# Patient Record
Sex: Female | Born: 1965 | Race: Black or African American | Hispanic: No | Marital: Single | Smoking: Never smoker
Health system: Southern US, Community
[De-identification: ages and names within clinical notes are randomized; demographics above are authoritative.]

## PROBLEM LIST (undated history)

## (undated) ENCOUNTER — Emergency Department (HOSPITAL_BASED_OUTPATIENT_CLINIC_OR_DEPARTMENT_OTHER): Admission: EM | Payer: BLUE CROSS/BLUE SHIELD | Source: Home / Self Care

## (undated) DIAGNOSIS — I10 Essential (primary) hypertension: Secondary | ICD-10-CM

## (undated) HISTORY — PX: MYOMECTOMY: SHX85

---

## 2010-05-13 ENCOUNTER — Emergency Department (HOSPITAL_BASED_OUTPATIENT_CLINIC_OR_DEPARTMENT_OTHER): Admission: EM | Admit: 2010-05-13 | Discharge: 2010-05-13 | Payer: Self-pay | Admitting: Emergency Medicine

## 2010-05-13 ENCOUNTER — Ambulatory Visit: Payer: Self-pay | Admitting: Diagnostic Radiology

## 2010-11-11 ENCOUNTER — Emergency Department (HOSPITAL_BASED_OUTPATIENT_CLINIC_OR_DEPARTMENT_OTHER): Admission: EM | Admit: 2010-11-11 | Discharge: 2010-11-11 | Payer: Self-pay | Admitting: Emergency Medicine

## 2010-11-11 ENCOUNTER — Ambulatory Visit: Payer: Self-pay | Admitting: Interventional Radiology

## 2011-03-11 LAB — DIFFERENTIAL
Basophils Absolute: 0.3 10*3/uL — ABNORMAL HIGH (ref 0.0–0.1)
Basophils Relative: 4 % — ABNORMAL HIGH (ref 0–1)
Eosinophils Absolute: 0.1 10*3/uL (ref 0.0–0.7)
Eosinophils Relative: 1 % (ref 0–5)
Lymphocytes Relative: 27 % (ref 12–46)
Monocytes Absolute: 0.9 10*3/uL (ref 0.1–1.0)

## 2011-03-11 LAB — BASIC METABOLIC PANEL
BUN: 8 mg/dL (ref 6–23)
CO2: 25 mEq/L (ref 19–32)
Chloride: 103 mEq/L (ref 96–112)
Creatinine, Ser: 0.8 mg/dL (ref 0.4–1.2)
Glucose, Bld: 93 mg/dL (ref 70–99)
Potassium: 3.8 mEq/L (ref 3.5–5.1)

## 2011-03-11 LAB — CBC
HCT: 34.3 % — ABNORMAL LOW (ref 36.0–46.0)
MCH: 25.2 pg — ABNORMAL LOW (ref 26.0–34.0)
MCHC: 32.1 g/dL (ref 30.0–36.0)
MCV: 78.5 fL (ref 78.0–100.0)
RDW: 15.8 % — ABNORMAL HIGH (ref 11.5–15.5)

## 2011-03-11 LAB — POCT CARDIAC MARKERS
CKMB, poc: <1 ng/mL — ABNORMAL LOW (ref 1.0–8.0)
CKMB, poc: <1 ng/mL — ABNORMAL LOW (ref 1.0–8.0)
Myoglobin, poc: 53.4 ng/mL (ref 12–200)
Troponin i, poc: <0.05 ng/mL (ref 0.00–0.09)

## 2011-03-17 LAB — URINALYSIS, ROUTINE W REFLEX MICROSCOPIC
Glucose, UA: NEGATIVE mg/dL
Hgb urine dipstick: NEGATIVE
Ketones, ur: NEGATIVE mg/dL
Protein, ur: NEGATIVE mg/dL

## 2011-04-26 ENCOUNTER — Emergency Department (HOSPITAL_BASED_OUTPATIENT_CLINIC_OR_DEPARTMENT_OTHER)
Admission: EM | Admit: 2011-04-26 | Discharge: 2011-04-26 | Disposition: A | Discharge status: Home / Self Care | Payer: No Typology Code available for payment source | Attending: Emergency Medicine | Admitting: Emergency Medicine

## 2011-04-26 ENCOUNTER — Emergency Department (INDEPENDENT_AMBULATORY_CARE_PROVIDER_SITE_OTHER): Payer: No Typology Code available for payment source

## 2011-04-26 DIAGNOSIS — K219 Gastro-esophageal reflux disease without esophagitis: Secondary | ICD-10-CM | POA: Insufficient documentation

## 2011-04-26 DIAGNOSIS — Y92009 Unspecified place in unspecified non-institutional (private) residence as the place of occurrence of the external cause: Secondary | ICD-10-CM | POA: Insufficient documentation

## 2011-04-26 DIAGNOSIS — S335XXA Sprain of ligaments of lumbar spine, initial encounter: Secondary | ICD-10-CM | POA: Insufficient documentation

## 2011-04-26 DIAGNOSIS — X503XXA Overexertion from repetitive movements, initial encounter: Secondary | ICD-10-CM | POA: Insufficient documentation

## 2011-04-26 DIAGNOSIS — M545 Low back pain: Secondary | ICD-10-CM

## 2011-08-23 ENCOUNTER — Emergency Department (HOSPITAL_BASED_OUTPATIENT_CLINIC_OR_DEPARTMENT_OTHER)
Admission: EM | Admit: 2011-08-23 | Discharge: 2011-08-23 | Disposition: A | Discharge status: Home / Self Care | Payer: No Typology Code available for payment source | Attending: Emergency Medicine | Admitting: Emergency Medicine

## 2011-08-23 ENCOUNTER — Emergency Department (INDEPENDENT_AMBULATORY_CARE_PROVIDER_SITE_OTHER): Payer: No Typology Code available for payment source

## 2011-08-23 ENCOUNTER — Encounter: Payer: Self-pay | Admitting: *Deleted

## 2011-08-23 DIAGNOSIS — X58XXXA Exposure to other specified factors, initial encounter: Secondary | ICD-10-CM | POA: Insufficient documentation

## 2011-08-23 DIAGNOSIS — S339XXA Sprain of unspecified parts of lumbar spine and pelvis, initial encounter: Secondary | ICD-10-CM | POA: Insufficient documentation

## 2011-08-23 DIAGNOSIS — S39012A Strain of muscle, fascia and tendon of lower back, initial encounter: Secondary | ICD-10-CM

## 2011-08-23 DIAGNOSIS — M25539 Pain in unspecified wrist: Secondary | ICD-10-CM

## 2011-08-23 DIAGNOSIS — Z79899 Other long term (current) drug therapy: Secondary | ICD-10-CM | POA: Insufficient documentation

## 2011-08-23 DIAGNOSIS — S63509A Unspecified sprain of unspecified wrist, initial encounter: Secondary | ICD-10-CM | POA: Insufficient documentation

## 2011-08-23 MED ORDER — NAPROXEN 500 MG PO TABS: 500.0000 mg | ORAL_TABLET | Freq: Two times a day (BID) | ORAL | Status: DC

## 2011-08-23 MED ORDER — IBUPROFEN 800 MG PO TABS
800.0000 mg | ORAL_TABLET | Freq: Once | ORAL | Status: AC
  Administered 2011-08-23: 800 mg via ORAL
  Filled 2011-08-23: qty 1

## 2011-08-23 MED ORDER — TRAMADOL HCL 50 MG PO TABS: 50.0000 mg | ORAL_TABLET | Freq: Four times a day (QID) | ORAL | Status: DC | PRN

## 2011-08-23 NOTE — ED Notes (Addendum)
Pt states that she cannot take tramadol because it makes her sick on her stomach.  Pt states that she would like to 800mg  of motrin in place of the tramadol.  Dr Brooke Dare states that is fine, removed RX for tramadol from her d/c paperwork

## 2011-08-23 NOTE — ED Notes (Signed)
Pt states she was helping her sister move some stuff on Friday and a piece of furniture fell back on her. Now c/o left wrist and lower back pain.

## 2011-08-23 NOTE — ED Provider Notes (Signed)
History   Chart scribed for Dayton Bailiff, MD by Dayton Bailiff; the patient was seen in room MH07/MH07; this patient's care was started at 8:26 PM.    CSN: [id number] Arrival date & time: 08/23/2011  8:19 PM  Chief Complaint  Patient presents with  . Muscle Pain   HPI Kelli Khan is a 45 y.o. female who presents to the Emergency Department complaining of wrist pain. Pt states left wrist pain onset after moving/heavy lifting yesterday, describes pain as an aching that is worse with movement of wrist. Pt tried heat on wrist with minimal relief today. Also c/o diffuse back pain, also described as aching that is worse with movement, since yesterday as well. No numbness, tingling, or weakness in upper or lower extremities. No bowel or bladder incontinence. No other complaints.  PAST MEDICAL HISTORY:  History reviewed. No pertinent past medical history.   PAST SURGICAL HISTORY:  History reviewed. No pertinent past surgical history.  MEDICATIONS:  Previous Medications   ACETAMINOPHEN (TYLENOL) 500 MG TABLET    Take 1,000 mg by mouth every 6 (six) hours as needed. For pain    CALCIUM CARBONATE (TUMS - DOSED IN MG ELEMENTAL CALCIUM) 500 MG CHEWABLE TABLET    Chew 2 tablets by mouth 2 (two) times daily as needed. For reflux     PRESCRIPTION MEDICATION    Apply 1 application topically 2 (two) times daily. Ointment for dry skin on face      ALLERGIES:  Allergies as of 08/23/2011 - Review Complete 08/23/2011  Allergen Reaction Noted  . Codeine Itching 08/23/2011     FAMILY HISTORY:  History reviewed. No pertinent family history.   SOCIAL HISTORY: History   Social History  . Marital Status: Single    Spouse Name: N/A    Number of Children: N/A  . Years of Education: N/A   Occupational History  . Not on file.   Social History Main Topics  . Smoking status: Never Smoker   . Smokeless tobacco: Not on file  . Alcohol Use: No  . Drug Use: No  . Sexually Active: No   Other Topics  Concern  . Not on file   Social History Narrative  . No narrative on file    Review of Systems 10 Systems reviewed and are negative for acute change except as noted in the HPI.  Physical Exam  BP 157/87  Pulse 70  Temp(Src) 98.6 F (37 C) (Oral)  Resp 20  Ht 5' 7.5" (1.715 m)  Wt 200 lb (90.719 kg)  BMI 30.86 kg/m2  SpO2 98%  LMP 07/23/2011  Physical Exam  Nursing note and vitals reviewed. Constitutional: She is oriented to person, place, and time. She appears well-developed and well-nourished. No distress.  HENT:  Head: Normocephalic.  Mouth/Throat: Mucous membranes are normal.  Eyes: Conjunctivae are normal.  Neck: Normal range of motion. Neck supple.  Cardiovascular: Intact distal pulses.   Pulmonary/Chest: Effort normal.  Musculoskeletal: Normal range of motion.       Diffuse back tenderness, no midline tenderness; FROM left wrist with some pain, no point tenderness, bruising or swelling; NVI  Neurological: She is alert and oriented to person, place, and time.       Normal strength BUE and BLE   Skin: Skin is warm and dry. No rash noted.  Psychiatric: She has a normal mood and affect.    ED Course  Procedures  OTHER DATA REVIEWED: Nursing notes and vital signs reviewed. Prior records reviewed.   LABS /  RADIOLOGY: Dg Wrist Complete Left  08/23/2011  *RADIOLOGY REPORT*  Clinical Data: Left wrist pain.  LEFT WRIST - COMPLETE 3+ VIEW  Comparison: None  Findings: Scapholunate space is upper normal limits.  No acute fracture or dislocation.  No aggressive osseous lesion.  IMPRESSION: Scapholunate space at upper normal limits, can be seen with ligamentous injury in the appropriate clinical setting.  No acute fracture or dislocation.  Original Report Authenticated By: Waneta Martins, M.D.    MDM: The patient had no evidence of fracture on her imaging. She did have widening of the scapholunate space within the upper limits of normal. It is still within normal  limits there is no need for urgent orthopedic evaluation. The patient was placed in a Velcro splint she is to leave on except for sleep. There no concerns for a spinal fracture she has no midline tenderness. There is no concern for neurologic injury as her neurologic exam is normal. She is provided a number to follow up with Dr. Magnus Ivan hand surgeon on call today. She'll be provided and anti-inflammatory medication as well as pain medication. She is instructed to apply-the first 2 days and heat thereafter.  IMPRESSION: 1. Sprain of wrist, unspecified site   2. Lumbosacral strain      PLAN: discharge All results reviewed and discussed with pt, questions answered, pt agreeable with plan.   CONDITION ON DISCHARGE: stable   MEDS GIVEN IN ED: ibuprofen (ADVIL,MOTRIN) tablet 800 mg (800 mg Oral Given 08/23/11 2035)     DISCHARGE MEDICATIONS: New Prescriptions   NAPROXEN (NAPROSYN) 500 MG TABLET    Take 1 tablet (500 mg total) by mouth 2 (two) times daily.   TRAMADOL (ULTRAM) 50 MG TABLET    Take 1 tablet (50 mg total) by mouth every 6 (six) hours as needed for pain.     SCRIBE ATTESTATION: I personally performed the services described in this documentation, which was scribed in my presence. The recorded information has been reviewed and considered.       Dayton Bailiff, MD 08/23/11 2203

## 2012-05-17 ENCOUNTER — Encounter (HOSPITAL_BASED_OUTPATIENT_CLINIC_OR_DEPARTMENT_OTHER): Payer: Self-pay | Admitting: Family Medicine

## 2012-05-17 ENCOUNTER — Emergency Department (HOSPITAL_BASED_OUTPATIENT_CLINIC_OR_DEPARTMENT_OTHER): Payer: No Typology Code available for payment source

## 2012-05-17 ENCOUNTER — Emergency Department (HOSPITAL_BASED_OUTPATIENT_CLINIC_OR_DEPARTMENT_OTHER)
Admission: EM | Admit: 2012-05-17 | Discharge: 2012-05-17 | Disposition: A | Discharge status: Home / Self Care | Payer: No Typology Code available for payment source | Attending: Emergency Medicine | Admitting: Emergency Medicine

## 2012-05-17 DIAGNOSIS — I10 Essential (primary) hypertension: Secondary | ICD-10-CM | POA: Insufficient documentation

## 2012-05-17 DIAGNOSIS — R05 Cough: Secondary | ICD-10-CM | POA: Insufficient documentation

## 2012-05-17 DIAGNOSIS — R079 Chest pain, unspecified: Secondary | ICD-10-CM | POA: Insufficient documentation

## 2012-05-17 DIAGNOSIS — S46019A Strain of muscle(s) and tendon(s) of the rotator cuff of unspecified shoulder, initial encounter: Secondary | ICD-10-CM

## 2012-05-17 DIAGNOSIS — M546 Pain in thoracic spine: Secondary | ICD-10-CM | POA: Insufficient documentation

## 2012-05-17 DIAGNOSIS — R059 Cough, unspecified: Secondary | ICD-10-CM | POA: Insufficient documentation

## 2012-05-17 DIAGNOSIS — S43429A Sprain of unspecified rotator cuff capsule, initial encounter: Secondary | ICD-10-CM | POA: Insufficient documentation

## 2012-05-17 DIAGNOSIS — X503XXA Overexertion from repetitive movements, initial encounter: Secondary | ICD-10-CM | POA: Insufficient documentation

## 2012-05-17 HISTORY — DX: Essential (primary) hypertension: I10

## 2012-05-17 LAB — CBC
HCT: 31.7 % — ABNORMAL LOW (ref 36.0–46.0)
MCHC: 32.2 g/dL (ref 30.0–36.0)
MCV: 74.4 fL — ABNORMAL LOW (ref 78.0–100.0)
RBC: 4.26 MIL/uL (ref 3.87–5.11)
WBC: 7.1 10*3/uL (ref 4.0–10.5)

## 2012-05-17 LAB — CARDIAC PANEL(CRET KIN+CKTOT+MB+TROPI): Relative Index: 2 (ref 0.0–2.5)

## 2012-05-17 LAB — BASIC METABOLIC PANEL
CO2: 25 mEq/L (ref 19–32)
Chloride: 100 mEq/L (ref 96–112)
GFR calc non Af Amer: >90 mL/min (ref >90)
Sodium: 136 mEq/L (ref 135–145)

## 2012-05-17 MED ORDER — HYDROCODONE-ACETAMINOPHEN 5-325 MG PO TABS
1.0000 | ORAL_TABLET | ORAL | Status: DC | PRN
  Administered 2012-05-17: 1 via ORAL
  Filled 2012-05-17: qty 1

## 2012-05-17 MED ORDER — HYDROCODONE-ACETAMINOPHEN 5-325 MG PO TABS: 1.0000 | ORAL_TABLET | ORAL | Status: AC | PRN

## 2012-05-17 NOTE — ED Notes (Signed)
Report received from Steven RN. Assuming care of patient at this time. 

## 2012-05-17 NOTE — ED Provider Notes (Signed)
History     CSN: [id number]  Arrival date & time 05/17/12  1815   First MD Initiated Contact with Patient 05/17/12 1832      Chief Complaint  Patient presents with  . Chest Pain    (Consider location/radiation/quality/duration/timing/severity/associated sxs/prior treatment) HPI The patient is a 46 yo woman, history of HTN, presenting with back and chest pain.  The patient notes a 2-day history of back and chest pain, described as a sharp, shooting, "lightning-like" pain, which originates in her right upper back and travels to her right chest diffusely, lasting 1-2 seconds before subsiding, occurring every 10-30 minutes.  The pain occurs at rest, but can be triggered by certain right arm or torso movements.  She notes that the pain "takes her breath away", but she notes no SOB once the initial pain subsides.  No nausea, diaphoresis.  She notes lifting some heavy boxes for work 1-2 days before symptom onset.  She notes a dry cough for the last month which she attributes to allergies.  No abd pain, nausea, vomiting, diarrhea, constipation, LE edema, dizziness, presyncope.  She has a family history of MI in mother in 30's-40's and HTN, but no HL, smoking, or DM.  Past Medical History  Diagnosis Date  . Hypertension   . Anemia     Past Surgical History  Procedure Date  . Myomectomy     Family History  Problem Relation Age of Onset  . Heart attack Mother     age "57-40's"  . Heart attack Sister     age "87-50's:    History  Substance Use Topics  . Smoking status: Never Smoker   . Smokeless tobacco: Not on file  . Alcohol Use: Yes    OB History    Grav Para Term Preterm Abortions TAB SAB Ect Mult Living                  Review of Systems ROS General: no fevers, chills, changes in weight, changes in appetite Skin: no rash HEENT: no blurry vision, hearing changes, sore throat Pulm: no dyspnea, coughing, wheezing CV: no chest pain, palpitations, shortness of breath Abd:  no abdominal pain, nausea/vomiting, diarrhea/constipation GU: no dysuria, hematuria, polyuria Ext: no arthralgias, myalgias Neuro: no weakness, numbness, or tingling   Allergies  Codeine  Home Medications   Current Outpatient Rx  Name Route Sig Dispense Refill  . HYDROCHLOROTHIAZIDE PO Oral Take by mouth.    . ACETAMINOPHEN 500 MG PO TABS Oral Take 1,000 mg by mouth every 6 (six) hours as needed. For pain     . CALCIUM CARBONATE ANTACID 500 MG PO CHEW Oral Chew 2 tablets by mouth 2 (two) times daily as needed. For reflux      . NAPROXEN 500 MG PO TABS Oral Take 1 tablet (500 mg total) by mouth 2 (two) times daily. 30 tablet 0  . PRESCRIPTION MEDICATION Topical Apply 1 application topically 2 (two) times daily. Ointment for dry skin on face       BP 141/79  Pulse 76  Temp(Src) 98.9 F (37.2 C) (Oral)  Resp 16  Ht 5' 7.5" (1.715 m)  Wt 232 lb (105.235 kg)  BMI 35.80 kg/m2  SpO2 100%  LMP 05/13/2012  Physical Exam General: alert, cooperative, appears mildly anxious HEENT: pupils equal round and reactive to light, vision grossly intact, oropharynx clear and non-erythematous  Neck: supple, no lymphadenopathy Lungs: clear to ascultation bilaterally, normal work of respiration, no wheezes, rales, ronchi Heart: regular  rate and rhythm, no murmurs, gallops, or rubs Abdomen: soft, non-tender, non-distended, normal bowel sounds Msk: Back ttp of lateral and inferior scapula, + neer and hawkins tests (impingement), + supraspinatus test, +subscapularis test, - infraspinatus test.  Msk tests fully reproduce patient's presenting complaint of back pain, but produce no radiation to chest. Neurologic: alert & oriented X3, cranial nerves II-XII intact, strength 4/5 with right shoulder adduction, abduction, and flexion, otherwise strength 5/5 throughout, sensation intact to light touch  ED Course  Procedures (including critical care time)  Labs Reviewed  CBC - Abnormal; Notable for the  following:    Hemoglobin 10.2 (*)    HCT 31.7 (*)    MCV 74.4 (*)    MCH 23.9 (*)    RDW 16.0 (*)    Platelets 407 (*)    All other components within normal limits  CARDIAC PANEL(CRET KIN+CKTOT+MB+TROPI)  BASIC METABOLIC PANEL   Dg Chest 2 View  05/17/2012  *RADIOLOGY REPORT*  Clinical Data:  Right-sided chest pain and shortness of breath.  CHEST - 2 VIEW  Comparison: 11/11/2010  Findings: The heart size and mediastinal contours are within normal limits.  Both lungs are clear.  The visualized skeletal structures are unremarkable.  IMPRESSION: No active disease.  Original Report Authenticated By: Reola Calkins, M.D.   EKG: NSR, old TWI in III, AVR, and V1 seen on prior EKG 11/11/10, otherwise no ST abnormalities  No diagnosis found.    MDM   1. Chest/back pain - the patient notes a 2-day history of right shoulder pain, radiating to the chest, after lifting some heavy boxes.  Back pain is reproducible with impingement, supraspinatus, and subscapularis maneuvers.  Pain likely represents rotator cuff strain.  R/o ACS given +FH and +HTN, though TIMI score = 0, making UA vs NSTEMI very unlikely.  Pneumonia unlikely given lack of fever or productive cough. -EKG unremarkable -CXR -cardiac enzymes x1, bmet, cbc -norco prn for pain  2. Dispo - likely discharge home with instructions for exercises and pain management for rotator cuff strain, pending labs and cxr.  Awanda Mink 05/17/2012, 7:34 PM  8:05 pm - labs wnl, CE neg, CXR neg.  Will discharge home with instructions for exercises for rotator cuff strain.  Linward Headland, MD 05/17/12 2005

## 2012-05-17 NOTE — ED Notes (Signed)
Pt sts right sided chest and upper back pain started when sleeping Saturday morning. Pt sts pain is sharp and last 5-6seconds and is not as severe. Pt reports movement makes pain worse.

## 2012-05-17 NOTE — Discharge Instructions (Signed)
Impingement Syndrome, Rotator Cuff, Bursitis with Rehab Impingement syndrome is a condition that involves inflammation of the tendons of the rotator cuff and the subacromial bursa, that causes pain in the shoulder. The rotator cuff consists of four tendons and muscles that control much of the shoulder and upper arm function. The subacromial bursa is a fluid filled sac that helps reduce friction between the rotator cuff and one of the bones of the shoulder (acromion). Impingement syndrome is usually an overuse injury that causes swelling of the bursa (bursitis), swelling of the tendon (tendonitis), and/or a tear of the tendon (strain). Strains are classified into three categories. Grade 1 strains cause pain, but the tendon is not lengthened. Grade 2 strains include a lengthened ligament, due to the ligament being stretched or partially ruptured. With grade 2 strains there is still function, although the function may be decreased. Grade 3 strains include a complete tear of the tendon or muscle, and function is usually impaired. SYMPTOMS   Pain around the shoulder, often at the outer portion of the upper arm.   Pain that gets worse with shoulder function, especially when reaching overhead or lifting.   Sometimes, aching when not using the arm.   Pain that wakes you up at night.   Sometimes, tenderness, swelling, warmth, or redness over the affected area.   Loss of strength.   Limited motion of the shoulder, especially reaching behind the back (to the back pocket or to unhook bra) or across your body.   Crackling sound (crepitation) when moving the arm.   Biceps tendon pain and inflammation (in the front of the shoulder). Worse when bending the elbow or lifting.  CAUSES  Impingement syndrome is often an overuse injury, in which chronic (repetitive) motions cause the tendons or bursa to become inflamed. A strain occurs when a force is paced on the tendon or muscle that is greater than it can  withstand. Common mechanisms of injury include: Stress from sudden increase in duration, frequency, or intensity of training.  Direct hit (trauma) to the shoulder.   Aging, erosion of the tendon with normal use.   Bony bump on shoulder (acromial spur).  RISK INCREASES WITH:  Contact sports (football, wrestling, boxing).   Throwing sports (baseball, tennis, volleyball).   Weightlifting and bodybuilding.   Heavy labor.   Previous injury to the rotator cuff, including impingement.   Poor shoulder strength and flexibility.   Failure to warm up properly before activity.   Inadequate protective equipment.   Old age.   Bony bump on shoulder (acromial spur).  PREVENTION   Warm up and stretch properly before activity.   Allow for adequate recovery between workouts.   Maintain physical fitness:   Strength, flexibility, and endurance.   Cardiovascular fitness.   Learn and use proper exercise technique.  PROGNOSIS  If treated properly, impingement syndrome usually goes away within 6 weeks. Sometimes surgery is required.  RELATED COMPLICATIONS   Longer healing time if not properly treated, or if not given enough time to heal.   Recurring symptoms, that result in a chronic condition.   Shoulder stiffness, frozen shoulder, or loss of motion.   Rotator cuff tendon tear.   Recurring symptoms, especially if activity is resumed too soon, with overuse, with a direct blow, or when using poor technique.  TREATMENT  Treatment first involves the use of ice and medicine, to reduce pain and inflammation. The use of strengthening and stretching exercises may help reduce pain with activity. These exercises may   be performed at home or with a therapist. If non-surgical treatment is unsuccessful after more than 6 months, surgery may be advised. After surgery and rehabilitation, activity is usually possible in 3 months.  MEDICATION  If pain medicine is needed, nonsteroidal  anti-inflammatory medicines (aspirin and ibuprofen), or other minor pain relievers (acetaminophen), are often advised.   Do not take pain medicine for 7 days before surgery.   Prescription pain relievers may be given, if your caregiver thinks they are needed. Use only as directed and only as much as you need.   Corticosteroid injections may be given by your caregiver. These injections should be reserved for the most serious cases, because they may only be given a certain number of times.  HEAT AND COLD  Cold treatment (icing) should be applied for 10 to 15 minutes every 2 to 3 hours for inflammation and pain, and immediately after activity that aggravates your symptoms. Use ice packs or an ice massage.   Heat treatment may be used before performing stretching and strengthening activities prescribed by your caregiver, physical therapist, or athletic trainer. Use a heat pack or a warm water soak.  SEEK MEDICAL CARE IF:   Symptoms get worse or do not improve in 4 to 6 weeks, despite treatment.   New, unexplained symptoms develop. (Drugs used in treatment may produce side effects.)  EXERCISES  RANGE OF MOTION (ROM) AND STRETCHING EXERCISES - Impingement Syndrome (Rotator Cuff  Tendinitis, Bursitis) These exercises may help you when beginning to rehabilitate your injury. Your symptoms may go away with or without further involvement from your physician, physical therapist or athletic trainer. While completing these exercises, remember:   Restoring tissue flexibility helps normal motion to return to the joints. This allows healthier, less painful movement and activity.   An effective stretch should be held for at least 30 seconds.   A stretch should never be painful. You should only feel a gentle lengthening or release in the stretched tissue.  STRETCH - Flexion, Standing  Stand with good posture. With an underhand grip on your right / left hand, and an overhand grip on the opposite hand, grasp  a broomstick or cane so that your hands are a little more than shoulder width apart.   Keeping your right / left elbow straight and shoulder muscles relaxed, push the stick with your opposite hand, to raise your right / left arm in front of your body and then overhead. Raise your arm until you feel a stretch in your right / left shoulder, but before you have increased shoulder pain.   Try to avoid shrugging your right / left shoulder as your arm rises, by keeping your shoulder blade tucked down and toward your mid-back spine. Hold for __________ seconds.   Slowly return to the starting position.  Repeat __________ times. Complete this exercise __________ times per day. STRETCH - Abduction, Supine  Lie on your back. With an underhand grip on your right / left hand and an overhand grip on the opposite hand, grasp a broomstick or cane so that your hands are a little more than shoulder width apart.   Keeping your right / left elbow straight and your shoulder muscles relaxed, push the stick with your opposite hand, to raise your right / left arm out to the side of your body and then overhead. Raise your arm until you feel a stretch in your right / left shoulder, but before you have increased shoulder pain.   Try to avoid shrugging   your right / left shoulder as your arm rises, by keeping your shoulder blade tucked down and toward your mid-back spine. Hold for __________ seconds.   Slowly return to the starting position.  Repeat __________ times. Complete this exercise __________ times per day. ROM - Flexion, Active-Assisted  Lie on your back. You may bend your knees for comfort.   Grasp a broomstick or cane so your hands are about shoulder width apart. Your right / left hand should grip the end of the stick, so that your hand is positioned "thumbs-up," as if you were about to shake hands.   Using your healthy arm to lead, raise your right / left arm overhead, until you feel a gentle stretch in your  shoulder. Hold for __________ seconds.   Use the stick to assist in returning your right / left arm to its starting position.  Repeat __________ times. Complete this exercise __________ times per day.  ROM - Internal Rotation, Supine   Lie on your back on a firm surface. Place your right / left elbow about 60 degrees away from your side. Elevate your elbow with a folded towel, so that the elbow and shoulder are the same height.   Using a broomstick or cane and your strong arm, pull your right / left hand toward your body until you feel a gentle stretch, but no increase in your shoulder pain. Keep your shoulder and elbow in place throughout the exercise.   Hold for __________ seconds. Slowly return to the starting position.  Repeat __________ times. Complete this exercise __________ times per day. STRETCH - Internal Rotation  Place your right / left hand behind your back, palm up.   Throw a towel or belt over your opposite shoulder. Grasp the towel with your right / left hand.   While keeping an upright posture, gently pull up on the towel, until you feel a stretch in the front of your right / left shoulder.   Avoid shrugging your right / left shoulder as your arm rises, by keeping your shoulder blade tucked down and toward your mid-back spine.   Hold for __________ seconds. Release the stretch, by lowering your healthy hand.  Repeat __________ times. Complete this exercise __________ times per day. ROM - Internal Rotation   Using an underhand grip, grasp a stick behind your back with both hands.   While standing upright with good posture, slide the stick up your back until you feel a mild stretch in the front of your shoulder.   Hold for __________ seconds. Slowly return to your starting position.  Repeat __________ times. Complete this exercise __________ times per day.  STRETCH - Posterior Shoulder Capsule   Stand or sit with good posture. Grasp your right / left elbow and draw it  across your chest, keeping it at the same height as your shoulder.   Pull your elbow, so your upper arm comes in closer to your chest. Pull until you feel a gentle stretch in the back of your shoulder.   Hold for __________ seconds.  Repeat __________ times. Complete this exercise __________ times per day. STRENGTHENING EXERCISES - Impingement Syndrome (Rotator Cuff Tendinitis, Bursitis) These exercises may help you when beginning to rehabilitate your injury. They may resolve your symptoms with or without further involvement from your physician, physical therapist or athletic trainer. While completing these exercises, remember:  Muscles can gain both the endurance and the strength needed for everyday activities through controlled exercises.   Complete these exercises as   instructed by your physician, physical therapist or athletic trainer. Increase the resistance and repetitions only as guided.   You may experience muscle soreness or fatigue, but the pain or discomfort you are trying to eliminate should never worsen during these exercises. If this pain does get worse, stop and make sure you are following the directions exactly. If the pain is still present after adjustments, discontinue the exercise until you can discuss the trouble with your clinician.   During your recovery, avoid activity or exercises which involve actions that place your injured hand or elbow above your head or behind your back or head. These positions stress the tissues which you are trying to heal.  STRENGTH - Scapular Depression and Adduction   With good posture, sit on a firm chair. Support your arms in front of you, with pillows, arm rests, or on a table top. Have your elbows in line with the sides of your body.   Gently draw your shoulder blades down and toward your mid-back spine. Gradually increase the tension, without tensing the muscles along the top of your shoulders and the back of your neck.   Hold for  __________ seconds. Slowly release the tension and relax your muscles completely before starting the next repetition.   After you have practiced this exercise, remove the arm support and complete the exercise in standing as well as sitting position.  Repeat __________ times. Complete this exercise __________ times per day.  STRENGTH - Shoulder Abductors, Isometric  With good posture, stand or sit about 4-6 inches from a wall, with your right / left side facing the wall.   Bend your right / left elbow. Gently press your right / left elbow into the wall. Increase the pressure gradually, until you are pressing as hard as you can, without shrugging your shoulder or increasing any shoulder discomfort.   Hold for __________ seconds.   Release the tension slowly. Relax your shoulder muscles completely before you begin the next repetition.  Repeat __________ times. Complete this exercise __________ times per day.  STRENGTH - External Rotators, Isometric  Keep your right / left elbow at your side and bend it 90 degrees.   Step into a door frame so that the outside of your right / left wrist can press against the door frame without your upper arm leaving your side.   Gently press your right / left wrist into the door frame, as if you were trying to swing the back of your hand away from your stomach. Gradually increase the tension, until you are pressing as hard as you can, without shrugging your shoulder or increasing any shoulder discomfort.   Hold for __________ seconds.   Release the tension slowly. Relax your shoulder muscles completely before you begin the next repetition.  Repeat __________ times. Complete this exercise __________ times per day.  STRENGTH - Supraspinatus   Stand or sit with good posture. Grasp a __________ weight, or an exercise band or tubing, so that your hand is "thumbs-up," like you are shaking hands.   Slowly lift your right / left arm in a "V" away from your thigh,  diagonally into the space between your side and straight ahead. Lift your hand to shoulder height or as far as you can, without increasing any shoulder pain. At first, many people do not lift their hands above shoulder height.   Avoid shrugging your right / left shoulder as your arm rises, by keeping your shoulder blade tucked down and toward your mid-back   spine.   Hold for __________ seconds. Control the descent of your hand, as you slowly return to your starting position.  Repeat __________ times. Complete this exercise __________ times per day.  STRENGTH - External Rotators  Secure a rubber exercise band or tubing to a fixed object (table, pole) so that it is at the same height as your right / left elbow when you are standing or sitting on a firm surface.   Stand or sit so that the secured exercise band is at your uninjured side.   Bend your right / left elbow 90 degrees. Place a folded towel or small pillow under your right / left arm, so that your elbow is a few inches away from your side.   Keeping the tension on the exercise band, pull it away from your body, as if pivoting on your elbow. Be sure to keep your body steady, so that the movement is coming only from your rotating shoulder.   Hold for __________ seconds. Release the tension in a controlled manner, as you return to the starting position.  Repeat __________ times. Complete this exercise __________ times per day.  STRENGTH - Internal Rotators   Secure a rubber exercise band or tubing to a fixed object (table, pole) so that it is at the same height as your right / left elbow when you are standing or sitting on a firm surface.   Stand or sit so that the secured exercise band is at your right / left side.   Bend your elbow 90 degrees. Place a folded towel or small pillow under your right / left arm so that your elbow is a few inches away from your side.   Keeping the tension on the exercise band, pull it across your body,  toward your stomach. Be sure to keep your body steady, so that the movement is coming only from your rotating shoulder.   Hold for __________ seconds. Release the tension in a controlled manner, as you return to the starting position.  Repeat __________ times. Complete this exercise __________ times per day.  STRENGTH - Scapular Protractors, Standing   Stand arms length away from a wall. Place your hands on the wall, keeping your elbows straight.   Begin by dropping your shoulder blades down and toward your mid-back spine.   To strengthen your protractors, keep your shoulder blades down, but slide them forward on your rib cage. It will feel as if you are lifting the back of your rib cage away from the wall. This is a subtle motion and can be challenging to complete. Ask your caregiver for further instruction, if you are not sure you are doing the exercise correctly.   Hold for __________ seconds. Slowly return to the starting position, resting the muscles completely before starting the next repetition.  Repeat __________ times. Complete this exercise __________ times per day. STRENGTH - Scapular Protractors, Supine  Lie on your back on a firm surface. Extend your right / left arm straight into the air while holding a __________ weight in your hand.   Keeping your head and back in place, lift your shoulder off the floor.   Hold for __________ seconds. Slowly return to the starting position, and allow your muscles to relax completely before starting the next repetition.  Repeat __________ times. Complete this exercise __________ times per day. STRENGTH - Scapular Protractors, Quadruped  Get onto your hands and knees, with your shoulders directly over your hands (or as close as you can   be, comfortably).   Keeping your elbows locked, lift the back of your rib cage up into your shoulder blades, so your mid-back rounds out. Keep your neck muscles relaxed.   Hold this position for __________  seconds. Slowly return to the starting position and allow your muscles to relax completely before starting the next repetition.  Repeat __________ times. Complete this exercise __________ times per day.  STRENGTH - Scapular Retractors  Secure a rubber exercise band or tubing to a fixed object (table, pole), so that it is at the height of your shoulders when you are either standing, or sitting on a firm armless chair.   With a palm down grip, grasp an end of the band in each hand. Straighten your elbows and lift your hands straight in front of you, at shoulder height. Step back, away from the secured end of the band, until it becomes tense.   Squeezing your shoulder blades together, draw your elbows back toward your sides, as you bend them. Keep your upper arms lifted away from your body throughout the exercise.   Hold for __________ seconds. Slowly ease the tension on the band, as you reverse the directions and return to the starting position.  Repeat __________ times. Complete this exercise __________ times per day. STRENGTH - Shoulder Extensors   Secure a rubber exercise band or tubing to a fixed object (table, pole) so that it is at the height of your shoulders when you are either standing, or sitting on a firm armless chair.   With a thumbs-up grip, grasp an end of the band in each hand. Straighten your elbows and lift your hands straight in front of you, at shoulder height. Step back, away from the secured end of the band, until it becomes tense.   Squeezing your shoulder blades together, pull your hands down to the sides of your thighs. Do not allow your hands to go behind you.   Hold for __________ seconds. Slowly ease the tension on the band, as you reverse the directions and return to the starting position.  Repeat __________ times. Complete this exercise __________ times per day.  STRENGTH - Scapular Retractors and External Rotators   Secure a rubber exercise band or tubing to a  fixed object (table, pole) so that it is at the height as your shoulders, when you are either standing, or sitting on a firm armless chair.   With a palm down grip, grasp an end of the band in each hand. Bend your elbows 90 degrees and lift your elbows to shoulder height, at your sides. Step back, away from the secured end of the band, until it becomes tense.   Squeezing your shoulder blades together, rotate your shoulders so that your upper arms and elbows remain stationary, but your fists travel upward to head height.   Hold for __________ seconds. Slowly ease the tension on the band, as you reverse the directions and return to the starting position.  Repeat __________ times. Complete this exercise __________ times per day.  STRENGTH - Scapular Retractors and External Rotators, Rowing   Secure a rubber exercise band or tubing to a fixed object (table, pole) so that it is at the height of your shoulders, when you are either standing, or sitting on a firm armless chair.   With a palm down grip, grasp an end of the band in each hand. Straighten your elbows and lift your hands straight in front of you, at shoulder height. Step back, away from the   secured end of the band, until it becomes tense.   Step 1: Squeeze your shoulder blades together. Bending your elbows, draw your hands to your chest, as if you are rowing a boat. At the end of this motion, your hands and elbow should be at shoulder height and your elbows should be out to your sides.   Step 2: Rotate your shoulders, to raise your hands above your head. Your forearms should be vertical and your upper arms should be horizontal.   Hold for __________ seconds. Slowly ease the tension on the band, as you reverse the directions and return to the starting position.  Repeat __________ times. Complete this exercise __________ times per day.  STRENGTH - Scapular Depressors  Find a sturdy chair without wheels, such as a dining room chair.    Keeping your feet on the floor, and your hands on the chair arms, lift your bottom up from the seat, and lock your elbows.   Keeping your elbows straight, allow gravity to pull your body weight down. Your shoulders will rise toward your ears.   Raise your body against gravity by drawing your shoulder blades down your back, shortening the distance between your shoulders and ears. Although your feet should always maintain contact with the floor, your feet should progressively support less body weight, as you get stronger.   Hold for __________ seconds. In a controlled and slow manner, lower your body weight to begin the next repetition.  Repeat __________ times. Complete this exercise __________ times per day.  Document Released: 12/15/2005 Document Revised: 12/04/2011 Document Reviewed: 03/29/2009 [employer] Patient Information 2012 [employer] 

## 2012-05-18 NOTE — ED Provider Notes (Signed)
I saw and evaluated the patient, reviewed the resident's note and I agree with the findings and plan.   .Face to face Exam:  General:  Awake HEENT:  Atraumatic Resp:  Normal effort Abd:  Nondistended Neuro:No focal weakness Lymph: No adenopathy   Nelia Shi, MD 05/18/12 1644

## 2013-08-18 ENCOUNTER — Encounter (HOSPITAL_BASED_OUTPATIENT_CLINIC_OR_DEPARTMENT_OTHER): Payer: Self-pay | Admitting: Emergency Medicine

## 2013-08-18 ENCOUNTER — Emergency Department (HOSPITAL_BASED_OUTPATIENT_CLINIC_OR_DEPARTMENT_OTHER): Payer: No Typology Code available for payment source

## 2013-08-18 ENCOUNTER — Emergency Department (HOSPITAL_BASED_OUTPATIENT_CLINIC_OR_DEPARTMENT_OTHER)
Admission: EM | Admit: 2013-08-18 | Discharge: 2013-08-19 | Disposition: A | Discharge status: Home / Self Care | Payer: No Typology Code available for payment source | Attending: Emergency Medicine | Admitting: Emergency Medicine

## 2013-08-18 DIAGNOSIS — S39012A Strain of muscle, fascia and tendon of lower back, initial encounter: Secondary | ICD-10-CM

## 2013-08-18 DIAGNOSIS — Y929 Unspecified place or not applicable: Secondary | ICD-10-CM | POA: Insufficient documentation

## 2013-08-18 DIAGNOSIS — Z79899 Other long term (current) drug therapy: Secondary | ICD-10-CM | POA: Insufficient documentation

## 2013-08-18 DIAGNOSIS — D649 Anemia, unspecified: Secondary | ICD-10-CM | POA: Insufficient documentation

## 2013-08-18 DIAGNOSIS — X503XXA Overexertion from repetitive movements, initial encounter: Secondary | ICD-10-CM | POA: Insufficient documentation

## 2013-08-18 DIAGNOSIS — I10 Essential (primary) hypertension: Secondary | ICD-10-CM | POA: Insufficient documentation

## 2013-08-18 DIAGNOSIS — S335XXA Sprain of ligaments of lumbar spine, initial encounter: Secondary | ICD-10-CM | POA: Insufficient documentation

## 2013-08-18 DIAGNOSIS — Y9389 Activity, other specified: Secondary | ICD-10-CM | POA: Insufficient documentation

## 2013-08-18 MED ORDER — METHOCARBAMOL 500 MG PO TABS: 500.0000 mg | ORAL_TABLET | Freq: Two times a day (BID) | ORAL | Status: DC

## 2013-08-18 MED ORDER — METHOCARBAMOL 500 MG PO TABS
1000.0000 mg | ORAL_TABLET | Freq: Once | ORAL | Status: DC
  Filled 2013-08-18: qty 2

## 2013-08-18 MED ORDER — TRAMADOL HCL 50 MG PO TABS: 50.0000 mg | ORAL_TABLET | Freq: Four times a day (QID) | ORAL | Status: DC | PRN

## 2013-08-18 MED ORDER — IBUPROFEN 800 MG PO TABS: 800.0000 mg | ORAL_TABLET | Freq: Three times a day (TID) | ORAL | Status: DC

## 2013-08-18 MED ORDER — IBUPROFEN 800 MG PO TABS
800.0000 mg | ORAL_TABLET | Freq: Once | ORAL | Status: AC
  Administered 2013-08-18: 800 mg via ORAL
  Filled 2013-08-18: qty 1

## 2013-08-18 NOTE — ED Notes (Signed)
Pt is driving so robaxin withheld.

## 2013-08-18 NOTE — ED Provider Notes (Signed)
CSN: [id number]     Arrival date & time 08/18/13  2221 History     First MD Initiated Contact with Patient 08/18/13 2300     Chief Complaint  Patient presents with  . Back Pain   (Consider location/radiation/quality/duration/timing/severity/associated sxs/prior Treatment) Patient is a 47 y.o. female presenting with back pain. The history is provided by the patient.  Back Pain Location:  Sacro-iliac joint Quality:  Aching Radiates to: right buttock. Pain severity:  Moderate Pain is:  Same all the time Onset quality:  Sudden Timing:  Constant Progression:  Unchanged Chronicity:  New Context: lifting heavy objects   Relieved by:  Nothing Worsened by:  Nothing tried Ineffective treatments:  None tried Associated symptoms: no abdominal pain, no abdominal swelling, no bladder incontinence, no bowel incontinence, no chest pain, no dysuria, no fever, no headaches, no leg pain, no numbness, no paresthesias, no pelvic pain, no perianal numbness, no tingling, no weakness and no weight loss   Risk factors: no hx of cancer     Past Medical History  Diagnosis Date  . Hypertension   . Anemia    Past Surgical History  Procedure Laterality Date  . Myomectomy     Family History  Problem Relation Age of Onset  . Heart attack Mother     age "82-40's"  . Heart attack Sister     age "52-50's:   History  Substance Use Topics  . Smoking status: Never Smoker   . Smokeless tobacco: Not on file  . Alcohol Use: Yes   OB History   Grav Para Term Preterm Abortions TAB SAB Ect Mult Living                 Review of Systems  Constitutional: Negative for fever and weight loss.  Cardiovascular: Negative for chest pain.  Gastrointestinal: Negative for abdominal pain and bowel incontinence.  Genitourinary: Negative for bladder incontinence, dysuria and pelvic pain.  Musculoskeletal: Positive for back pain.  Neurological: Negative for tingling, weakness, numbness, headaches and paresthesias.   All other systems reviewed and are negative.    Allergies  Codeine  Home Medications   Current Outpatient Rx  Name  Route  Sig  Dispense  Refill  . hydrochlorothiazide (MICROZIDE) 12.5 MG capsule   Oral   Take 12.5 mg by mouth daily.         Marland Kitchen ibuprofen (ADVIL,MOTRIN) 200 MG tablet   Oral   Take 400 mg by mouth every 6 (six) hours as needed. Patient used this medication for chest discomfort.         . IRON CR PO   Oral   Take 1 tablet by mouth daily.         Marland Kitchen PRESCRIPTION MEDICATION   Topical   Apply 1 application topically 2 (two) times daily. Ointment for dry skin on face           BP 155/85  Pulse 81  Temp(Src) 98.5 F (36.9 C) (Oral)  Resp 16  Ht 5' 7.5" (1.715 m)  Wt 227 lb (102.967 kg)  BMI 35.01 kg/m2  SpO2 100% Physical Exam  Constitutional: She is oriented to person, place, and time. She appears well-developed and well-nourished. No distress.  HENT:  Head: Normocephalic and atraumatic.  Mouth/Throat: Oropharynx is clear and moist.  Eyes: Conjunctivae are normal. Pupils are equal, round, and reactive to light.  Neck: Normal range of motion. Neck supple.  Cardiovascular: Normal rate, regular rhythm and intact distal pulses.   Pulmonary/Chest: Effort  normal and breath sounds normal. She has no wheezes. She has no rales.  Abdominal: Soft. Bowel sounds are normal. There is no tenderness. There is no rebound and no guarding.  Musculoskeletal: Normal range of motion.  Neurological: She is alert and oriented to person, place, and time.  Skin: Skin is warm and dry.  Psychiatric: She has a normal mood and affect.    ED Course   Procedures (including critical care time)  Labs Reviewed - No data to display Dg Lumbar Spine Complete  08/18/2013   *RADIOLOGY REPORT*  Clinical Data: Back pain with tingling in the right lower extremity after moving boxes tonight.  LUMBAR SPINE - COMPLETE 4+ VIEW  Comparison: 04/26/2011 lumbar spine.  CT abdomen and pelvis  01/24/2011.  Findings: Five lumbar type vertebral bodies.  Slight anterior subluxation of L4 on L5 demonstrating mild progression since previous study.  Otherwise normal alignment of the lumbar spine. No vertebral compression deformities.  Intervertebral disc space heights are preserved.  No focal bone lesion or bone destruction. Bone cortex and trabecular architecture appear intact.  Large amount of stool in the colon without colonic distension. Calcifications in the pelvis probably represent calcified uterine fibroids.  These calcifications did demonstrate progression since previous study.  IMPRESSION: Slight anterior subluxation of L4 on L5 which is demonstrating some progression since previous study.  Otherwise normal alignment of the lumbar spine.  No displaced fractures are identified. Calcifications in the pelvis likely representing large uterine fibroids.   Original Report Authenticated By: Burman Nieves, M.D.   No diagnosis found.  MDM  Pain medication and follow up with your family doctor  Kasara Schomer K Jakub Debold-Rasch, MD 08/18/13 2354

## 2013-08-18 NOTE — ED Notes (Signed)
Pt is driving tonight

## 2013-08-18 NOTE — ED Notes (Signed)
Pt c/o right sided lower back pain radiating down right leg. Pt states she did move some boxes earlier today.

## 2013-12-20 ENCOUNTER — Emergency Department (HOSPITAL_BASED_OUTPATIENT_CLINIC_OR_DEPARTMENT_OTHER): Payer: No Typology Code available for payment source

## 2013-12-20 ENCOUNTER — Encounter (HOSPITAL_BASED_OUTPATIENT_CLINIC_OR_DEPARTMENT_OTHER): Payer: Self-pay | Admitting: Emergency Medicine

## 2013-12-20 ENCOUNTER — Emergency Department (HOSPITAL_BASED_OUTPATIENT_CLINIC_OR_DEPARTMENT_OTHER)
Admission: EM | Admit: 2013-12-20 | Discharge: 2013-12-20 | Disposition: A | Discharge status: Home / Self Care | Payer: No Typology Code available for payment source | Attending: Emergency Medicine | Admitting: Emergency Medicine

## 2013-12-20 DIAGNOSIS — Y929 Unspecified place or not applicable: Secondary | ICD-10-CM | POA: Insufficient documentation

## 2013-12-20 DIAGNOSIS — I10 Essential (primary) hypertension: Secondary | ICD-10-CM | POA: Insufficient documentation

## 2013-12-20 DIAGNOSIS — X58XXXA Exposure to other specified factors, initial encounter: Secondary | ICD-10-CM | POA: Insufficient documentation

## 2013-12-20 DIAGNOSIS — Y939 Activity, unspecified: Secondary | ICD-10-CM | POA: Insufficient documentation

## 2013-12-20 DIAGNOSIS — Z791 Long term (current) use of non-steroidal anti-inflammatories (NSAID): Secondary | ICD-10-CM | POA: Insufficient documentation

## 2013-12-20 DIAGNOSIS — S29011A Strain of muscle and tendon of front wall of thorax, initial encounter: Secondary | ICD-10-CM

## 2013-12-20 DIAGNOSIS — S23421A Sprain of chondrosternal joint, initial encounter: Secondary | ICD-10-CM | POA: Insufficient documentation

## 2013-12-20 DIAGNOSIS — D649 Anemia, unspecified: Secondary | ICD-10-CM | POA: Insufficient documentation

## 2013-12-20 DIAGNOSIS — Z79899 Other long term (current) drug therapy: Secondary | ICD-10-CM | POA: Insufficient documentation

## 2013-12-20 LAB — CBC WITH DIFFERENTIAL/PLATELET
Basophils Absolute: 0 10*3/uL (ref 0.0–0.1)
Lymphs Abs: 2.1 10*3/uL (ref 0.7–4.0)
MCH: 24.4 pg — ABNORMAL LOW (ref 26.0–34.0)
MCV: 77 fL — ABNORMAL LOW (ref 78.0–100.0)
Monocytes Absolute: 0.7 10*3/uL (ref 0.1–1.0)
Platelets: 349 10*3/uL (ref 150–400)
RDW: 22 % — ABNORMAL HIGH (ref 11.5–15.5)

## 2013-12-20 LAB — BASIC METABOLIC PANEL
BUN: 8 mg/dL (ref 6–23)
Creatinine, Ser: 0.7 mg/dL (ref 0.50–1.10)
GFR calc Af Amer: >90 mL/min (ref >90)
GFR calc non Af Amer: >90 mL/min (ref >90)

## 2013-12-20 MED ORDER — OXYCODONE-ACETAMINOPHEN 5-325 MG PO TABS
1.0000 | ORAL_TABLET | Freq: Once | ORAL | Status: AC
  Administered 2013-12-20: 1 via ORAL
  Filled 2013-12-20: qty 1

## 2013-12-20 MED ORDER — OXYCODONE-ACETAMINOPHEN 5-325 MG PO TABS: 1.0000 | ORAL_TABLET | Freq: Four times a day (QID) | ORAL | Status: DC | PRN

## 2013-12-20 NOTE — ED Provider Notes (Signed)
CSN: [id number]     Arrival date & time 12/20/13  1500 History   First MD Initiated Contact with Patient 12/20/13 1539     Chief Complaint  Patient presents with  . Chest Pain   (Consider location/radiation/quality/duration/timing/severity/associated sxs/prior Treatment) Patient is a 47 y.o. female presenting with chest pain. The history is provided by the patient.  Chest Pain Pain location:  L chest Pain quality: aching   Pain radiates to:  L shoulder and L arm Pain radiates to the back: no   Pain severity:  Moderate Onset quality:  Gradual Duration:  2 days Timing:  Constant Progression:  Unchanged Chronicity:  New Context: lifting   Context: not breathing and no drug use   Relieved by:  Nothing Worsened by:  Nothing tried Ineffective treatments: naproxen. Associated symptoms: no abdominal pain, no back pain, no cough, no dizziness, no fatigue, no fever, no headache, no nausea, no shortness of breath and not vomiting     Past Medical History  Diagnosis Date  . Hypertension   . Anemia    Past Surgical History  Procedure Laterality Date  . Myomectomy     Family History  Problem Relation Age of Onset  . Heart attack Mother     age "1-40's"  . Heart attack Sister     age "33-50's:   History  Substance Use Topics  . Smoking status: Never Smoker   . Smokeless tobacco: Not on file  . Alcohol Use: No   OB History   Grav Para Term Preterm Abortions TAB SAB Ect Mult Living                 Review of Systems  Constitutional: Negative for fever and fatigue.  HENT: Negative for congestion and drooling.   Eyes: Negative for pain.  Respiratory: Negative for cough and shortness of breath.   Cardiovascular: Positive for chest pain.  Gastrointestinal: Negative for nausea, vomiting, abdominal pain and diarrhea.  Genitourinary: Negative for dysuria and hematuria.  Musculoskeletal: Negative for back pain, gait problem and neck pain.  Skin: Negative for color change.   Neurological: Negative for dizziness and headaches.  Hematological: Negative for adenopathy.  Psychiatric/Behavioral: Negative for behavioral problems.  All other systems reviewed and are negative.    Allergies  Codeine  Home Medications   Current Outpatient Rx  Name  Route  Sig  Dispense  Refill  . NAPROXEN PO   Oral   Take by mouth.         . hydrochlorothiazide (MICROZIDE) 12.5 MG capsule   Oral   Take 12.5 mg by mouth daily.         Marland Kitchen ibuprofen (ADVIL,MOTRIN) 200 MG tablet   Oral   Take 400 mg by mouth every 6 (six) hours as needed. Patient used this medication for chest discomfort.         Marland Kitchen ibuprofen (ADVIL,MOTRIN) 800 MG tablet   Oral   Take 1 tablet (800 mg total) by mouth 3 (three) times daily.   21 tablet   0   . IRON CR PO   Oral   Take 1 tablet by mouth daily.         . methocarbamol (ROBAXIN) 500 MG tablet   Oral   Take 1 tablet (500 mg total) by mouth 2 (two) times daily.   20 tablet   0   . PRESCRIPTION MEDICATION   Topical   Apply 1 application topically 2 (two) times daily. Ointment for dry skin on  face          . traMADol (ULTRAM) 50 MG tablet   Oral   Take 1 tablet (50 mg total) by mouth every 6 (six) hours as needed for pain.   15 tablet   0    BP 161/93  Pulse 70  Temp(Src) 99 F (37.2 C) (Oral)  Resp 18  Ht 5\' 7"  (1.702 m)  Wt 215 lb (97.523 kg)  BMI 33.67 kg/m2  SpO2 100% Physical Exam  Nursing note and vitals reviewed. Constitutional: She is oriented to person, place, and time. She appears well-developed and well-nourished.  HENT:  Head: Normocephalic.  Mouth/Throat: Oropharynx is clear and moist. No oropharyngeal exudate.  Eyes: Conjunctivae and EOM are normal. Pupils are equal, round, and reactive to light.  Neck: Normal range of motion. Neck supple.  Cardiovascular: Normal rate, regular rhythm, normal heart sounds and intact distal pulses.  Exam reveals no gallop and no friction rub.   No murmur  heard. Pulmonary/Chest: Effort normal and breath sounds normal. No respiratory distress. She has no wheezes. She exhibits tenderness (the patient's pain is easily reproduced with mild palpation of the left upper anterior chest, the left shoulder and the left upper arm.).  Abdominal: Soft. Bowel sounds are normal. There is no tenderness. There is no rebound and no guarding.  Musculoskeletal: Normal range of motion. She exhibits no edema and no tenderness.  Pain is reproduced with range of motion of the left shoulder.  Neurological: She is alert and oriented to person, place, and time.  Skin: Skin is warm and dry.  Psychiatric: She has a normal mood and affect. Her behavior is normal.    ED Course  Procedures (including critical care time) Labs Review Labs Reviewed  CBC WITH DIFFERENTIAL - Abnormal; Notable for the following:    Hemoglobin 10.6 (*)    HCT 33.4 (*)    MCV 77.0 (*)    MCH 24.4 (*)    RDW 22.0 (*)    All other components within normal limits  TROPONIN I  BASIC METABOLIC PANEL   Imaging Review Dg Chest 2 View  12/20/2013   CLINICAL DATA:  Chest pain  EXAM: CHEST  2 VIEW  COMPARISON:  May 17, 2012  FINDINGS: Lungs are clear. Heart size and pulmonary vascularity are normal. No adenopathy. No pneumothorax. No bone lesions. There is slight mid thoracic levoscoliosis.  IMPRESSION: No edema or consolidation.   Electronically Signed   By: Bretta Bang M.D.   On: 12/20/2013 16:19    EKG Interpretation    Date/Time:  Tuesday December 20 2013 15:13:11 EST Ventricular Rate:  69 PR Interval:  134 QRS Duration: 70 QT Interval:  398 QTC Calculation: 426 R Axis:   13 Text Interpretation:  Normal sinus rhythm Low voltage QRS isolated t wave inversion in lead III appears unchanged No significant change since last tracing Confirmed by Ricard Faulkner  MD, Mort Smelser ([id number]) on 12/20/2013 4:56:17 PM            MDM   1. Chest wall muscle strain, initial encounter    3:53 PM 47  y.o. female who presents with left shoulder chest and arm pain which began 2 days ago. The patient states prior to that she had been lifting some boxes and carrying luggage. She states that the pain began gradually and has remained constant since then. She states that it is unwavering. She notes that the pain is reproducible with palpation and range of motion of her shoulder. She  is afebrile and vital signs are unremarkable here. She denies any shortness of breath. She has a history of hypertension and a family history of cardiac disease. I suspect this is a musculoskeletal cause for her pain. Will get screening labwork and Percocet for pain.   I interpreted/reviewed the labs and/or imaging which were non-contributory.  Low risk for MACE per HEART score. I suspect this is msk pain, and pt feeling better on exam.  I have discussed the diagnosis/risks/treatment options with the patient and believe the pt to be eligible for discharge home to follow-up with pcp in 1 week. We also discussed returning to the ED immediately if new or worsening sx occur. We discussed the sx which are most concerning (e.g., worsening pain, sob, fever) that necessitate immediate return. Any new prescriptions provided to the patient are listed below.  Discharge Medication List as of 12/20/2013  5:19 PM    START taking these medications   Details  oxyCODONE-acetaminophen (PERCOCET) 5-325 MG per tablet Take 1 tablet by mouth every 6 (six) hours as needed for moderate pain., Starting 12/20/2013, Until Discontinued, Print         Junius Argyle, MD 12/21/13 1100

## 2013-12-20 NOTE — ED Notes (Signed)
C/o CP started last night-increase pain with movement

## 2013-12-20 NOTE — ED Notes (Signed)
MD at bedside. 

## 2014-01-12 ENCOUNTER — Emergency Department (HOSPITAL_BASED_OUTPATIENT_CLINIC_OR_DEPARTMENT_OTHER)
Admission: EM | Admit: 2014-01-12 | Discharge: 2014-01-12 | Disposition: A | Discharge status: Home / Self Care | Payer: No Typology Code available for payment source | Attending: Emergency Medicine | Admitting: Emergency Medicine

## 2014-01-12 ENCOUNTER — Emergency Department (HOSPITAL_BASED_OUTPATIENT_CLINIC_OR_DEPARTMENT_OTHER): Payer: No Typology Code available for payment source

## 2014-01-12 ENCOUNTER — Encounter (HOSPITAL_BASED_OUTPATIENT_CLINIC_OR_DEPARTMENT_OTHER): Payer: Self-pay | Admitting: Emergency Medicine

## 2014-01-12 DIAGNOSIS — M79605 Pain in left leg: Secondary | ICD-10-CM

## 2014-01-12 DIAGNOSIS — Y929 Unspecified place or not applicable: Secondary | ICD-10-CM | POA: Insufficient documentation

## 2014-01-12 DIAGNOSIS — M25512 Pain in left shoulder: Secondary | ICD-10-CM

## 2014-01-12 DIAGNOSIS — W108XXA Fall (on) (from) other stairs and steps, initial encounter: Secondary | ICD-10-CM | POA: Insufficient documentation

## 2014-01-12 DIAGNOSIS — Y9301 Activity, walking, marching and hiking: Secondary | ICD-10-CM | POA: Insufficient documentation

## 2014-01-12 DIAGNOSIS — Z79899 Other long term (current) drug therapy: Secondary | ICD-10-CM | POA: Insufficient documentation

## 2014-01-12 DIAGNOSIS — D649 Anemia, unspecified: Secondary | ICD-10-CM | POA: Insufficient documentation

## 2014-01-12 DIAGNOSIS — I10 Essential (primary) hypertension: Secondary | ICD-10-CM | POA: Insufficient documentation

## 2014-01-12 DIAGNOSIS — S4980XA Other specified injuries of shoulder and upper arm, unspecified arm, initial encounter: Secondary | ICD-10-CM | POA: Insufficient documentation

## 2014-01-12 DIAGNOSIS — W19XXXA Unspecified fall, initial encounter: Secondary | ICD-10-CM

## 2014-01-12 DIAGNOSIS — S46909A Unspecified injury of unspecified muscle, fascia and tendon at shoulder and upper arm level, unspecified arm, initial encounter: Secondary | ICD-10-CM | POA: Insufficient documentation

## 2014-01-12 DIAGNOSIS — T148XXA Other injury of unspecified body region, initial encounter: Secondary | ICD-10-CM | POA: Insufficient documentation

## 2014-01-12 MED ORDER — ONDANSETRON HCL 4 MG PO TABS: 4.0000 mg | ORAL_TABLET | Freq: Four times a day (QID) | ORAL | Status: DC

## 2014-01-12 MED ORDER — IBUPROFEN 800 MG PO TABS: 800.0000 mg | ORAL_TABLET | Freq: Three times a day (TID) | ORAL | Status: DC | PRN

## 2014-01-12 MED ORDER — OXYCODONE-ACETAMINOPHEN 5-325 MG PO TABS
2.0000 | ORAL_TABLET | Freq: Once | ORAL | Status: AC
  Administered 2014-01-12: 2 via ORAL
  Filled 2014-01-12: qty 2

## 2014-01-12 MED ORDER — OXYCODONE-ACETAMINOPHEN 5-325 MG PO TABS: 1.0000 | ORAL_TABLET | ORAL | Status: DC | PRN

## 2014-01-12 MED ORDER — ONDANSETRON 4 MG PO TBDP
4.0000 mg | ORAL_TABLET | Freq: Once | ORAL | Status: AC
  Administered 2014-01-12: 4 mg via ORAL
  Filled 2014-01-12: qty 1

## 2014-01-12 NOTE — ED Provider Notes (Signed)
TIME SEEN: 11:30 AM  CHIEF COMPLAINT: Fall, left shoulder and left leg pain  HPI: Patient is a 48 year old female with history of hypertension who presents the emergency department after a mechanical fall this morning. Patient reports that she was walking down the stairs and missed one of the steps. She reports that her right leg went out front of her her left leg went behind her. She believes she caught herself with her left arm. She is complaining of left leg pain from her knee down to her foot and left shoulder pain. She is right-hand dominant. No numbness, tingling or focal weakness. No chest pain or shortness of breath. No abdominal pain. She denies any chest pain, shortness of breath, dizziness or palpitations that led to her fall. She is now anticoagulation. She did not hit her head.  ROS: See HPI Constitutional: no fever  Eyes: no drainage  ENT: no runny nose   Cardiovascular:  no chest pain  Resp: no SOB  GI: no vomiting GU: no dysuria Integumentary: no rash  Allergy: no hives  Musculoskeletal: no leg swelling  Neurological: no slurred speech ROS otherwise negative  PAST MEDICAL HISTORY/PAST SURGICAL HISTORY:  Past Medical History  Diagnosis Date  . Hypertension   . Anemia     MEDICATIONS:  Prior to Admission medications   Medication Sig Start Date End Date Taking? Authorizing Provider  hydrochlorothiazide (MICROZIDE) 12.5 MG capsule Take 12.5 mg by mouth daily.    Historical Provider, MD  ibuprofen (ADVIL,MOTRIN) 200 MG tablet Take 400 mg by mouth every 6 (six) hours as needed. Patient used this medication for chest discomfort.    Historical Provider, MD  ibuprofen (ADVIL,MOTRIN) 800 MG tablet Take 1 tablet (800 mg total) by mouth 3 (three) times daily. 08/18/13   April K Palumbo-Rasch, MD  IRON CR PO Take 1 tablet by mouth daily.    Historical Provider, MD  methocarbamol (ROBAXIN) 500 MG tablet Take 1 tablet (500 mg total) by mouth 2 (two) times daily. 08/18/13   April Alfonso Patten, MD  NAPROXEN PO Take by mouth.    Historical Provider, MD  oxyCODONE-acetaminophen (PERCOCET) 5-325 MG per tablet Take 1 tablet by mouth every 6 (six) hours as needed for moderate pain. 12/20/13   Blanchard Kelch, MD  PRESCRIPTION MEDICATION Apply 1 application topically 2 (two) times daily. Ointment for dry skin on face     Historical Provider, MD  traMADol (ULTRAM) 50 MG tablet Take 1 tablet (50 mg total) by mouth every 6 (six) hours as needed for pain. 08/18/13   April Alfonso Patten, MD    ALLERGIES:  Allergies  Allergen Reactions  . Codeine Itching    SOCIAL HISTORY:  History  Substance Use Topics  . Smoking status: Never Smoker   . Smokeless tobacco: Not on file  . Alcohol Use: No    FAMILY HISTORY: Family History  Problem Relation Age of Onset  . Heart attack Mother     age "61-40's"  . Heart attack Sister     age "18-50's:    EXAM: BP 143/84  Pulse 77  Temp(Src) 98.7 F (37.1 C) (Oral)  Ht 5' 7.5" (1.715 m)  Wt 198 lb (89.812 kg)  BMI 30.54 kg/m2  SpO2 100%  LMP 12/29/2013 CONSTITUTIONAL: Alert and oriented and responds appropriately to questions. Well-appearing; well-nourished; GCS 15 HEAD: Normocephalic; atraumatic EYES: Conjunctivae clear, PERRL, EOMI ENT: normal nose; no rhinorrhea; moist mucous membranes; pharynx without lesions noted; no dental injury; no septal hematoma NECK:  Supple, no meningismus, no LAD; no midline spinal tenderness, step-off or deformity CARD: RRR; S1 and S2 appreciated; no murmurs, no clicks, no rubs, no gallops RESP: Normal chest excursion without splinting or tachypnea; breath sounds clear and equal bilaterally; no wheezes, no rhonchi, no rales; chest wall stable, nontender to palpation ABD/GI: Normal bowel sounds; non-distended; soft, non-tender, no rebound, no guarding PELVIS:  stable, nontender to palpation BACK:  The back appears normal and is non-tender to palpation, there is no CVA tenderness; no midline  spinal tenderness, step-off or deformity EXT: Patient is tender to palpation over her dorsal left foot, left anterior shin, left knee diffusely and left anterior shoulder diffusely with decreased range of motion in these joints secondary to pain but no bony deformity or ecchymosis or swelling. Patient is neurovascularly intact distally with 2+ DP pulses bilaterally and 2+ radial pulses bilaterally with normal sensation in all 4 extremities. Otherwise patient has Normal ROM in all joints; non-tender to palpation; no edema; normal capillary refill; no cyanosis    SKIN: Normal color for age and race; warm NEURO: Moves all extremities equally, cranial nerves II through XII intact, sensation to light touch intact diffusely PSYCH: The patient's mood and manner are appropriate. Grooming and personal hygiene are appropriate.  MEDICAL DECISION MAKING: Patient here after mechanical fall. We'll obtain x-rays of her left foot, left ankle, left knee and left shoulder. We'll give oral pain medication. She is hemodynamically stable. She was able to ambulate after her fall.  ED PROGRESS: Patient's x-ray show no dislocation or fracture. We'll discharge with pain medication, orthopedic followup as needed and return precautions. We'll give crutches. Patient verbalizes understanding and is comfortable plan.     [place], DO 01/12/14 1301

## 2014-01-12 NOTE — Discharge Instructions (Signed)
Contusion A contusion is a deep bruise. Contusions are the result of an injury that caused bleeding under the skin. The contusion may turn blue, purple, or yellow. Minor injuries will give you a painless contusion, but more severe contusions may stay painful and swollen for a few weeks.  CAUSES  A contusion is usually caused by a blow, trauma, or direct force to an area of the body. SYMPTOMS   Swelling and redness of the injured area.  Bruising of the injured area.  Tenderness and soreness of the injured area.  Pain. DIAGNOSIS  The diagnosis can be made by taking a history and physical exam. An X-ray, CT scan, or MRI may be needed to determine if there were any associated injuries, such as fractures. TREATMENT  Specific treatment will depend on what area of the body was injured. In general, the best treatment for a contusion is resting, icing, elevating, and applying cold compresses to the injured area. Over-the-counter medicines may also be recommended for pain control. Ask your caregiver what the best treatment is for your contusion. HOME CARE INSTRUCTIONS   Put ice on the injured area.  Put ice in a plastic bag.  Place a towel between your skin and the bag.  Leave the ice on for 15-20 minutes, 03-04 times a day.  Only take over-the-counter or prescription medicines for pain, discomfort, or fever as directed by your caregiver. Your caregiver may recommend avoiding anti-inflammatory medicines (aspirin, ibuprofen, and naproxen) for 48 hours because these medicines may increase bruising.  Rest the injured area.  If possible, elevate the injured area to reduce swelling. SEEK IMMEDIATE MEDICAL CARE IF:   You have increased bruising or swelling.  You have pain that is getting worse.  Your swelling or pain is not relieved with medicines. MAKE SURE YOU:   Understand these instructions.  Will watch your condition.  Will get help right away if you are not doing well or get  worse. Document Released: 09/24/2005 Document Revised: 03/08/2012 Document Reviewed: 10/20/2011 [facility] Patient Information 2014 [place], [place].  Shoulder Pain The shoulder is the joint that connects your arms to your body. The bones that form the shoulder joint include the upper arm bone (humerus), the shoulder blade (scapula), and the collarbone (clavicle). The top of the humerus is shaped like a ball and fits into a rather flat socket on the scapula (glenoid cavity). A combination of muscles and strong, fibrous tissues that connect muscles to bones (tendons) support your shoulder joint and hold the ball in the socket. Small, fluid-filled sacs (bursae) are located in different areas of the joint. They act as cushions between the bones and the overlying soft tissues and help reduce friction between the gliding tendons and the bone as you move your arm. Your shoulder joint allows a wide range of motion in your arm. This range of motion allows you to do things like scratch your back or throw a ball. However, this range of motion also makes your shoulder more prone to pain from overuse and injury. Causes of shoulder pain can originate from both injury and overuse and usually can be grouped in the following four categories:  Redness, swelling, and pain (inflammation) of the tendon (tendinitis) or the bursae (bursitis).  Instability, such as a dislocation of the joint.  Inflammation of the joint (arthritis).  Broken bone (fracture). HOME CARE INSTRUCTIONS   Apply ice to the sore area.  Put ice in a plastic bag.  Place a towel between your skin and  the bag.  Leave the ice on for 15-20 minutes, 03-04 times per day for the first 2 days.  Stop using cold packs if they do not help with the pain.  If you have a shoulder sling or immobilizer, wear it as long as your caregiver instructs. Only remove it to shower or bathe. Move your arm as little as possible, but keep your hand moving to prevent  swelling.  Squeeze a soft ball or foam pad as much as possible to help prevent swelling.  Only take over-the-counter or prescription medicines for pain, discomfort, or fever as directed by your caregiver. SEEK MEDICAL CARE IF:   Your shoulder pain increases, or new pain develops in your arm, hand, or fingers.  Your hand or fingers become cold and numb.  Your pain is not relieved with medicines. SEEK IMMEDIATE MEDICAL CARE IF:   Your arm, hand, or fingers are numb or tingling.  Your arm, hand, or fingers are significantly swollen or turn white or blue. MAKE SURE YOU:   Understand these instructions.  Will watch your condition.  Will get help right away if you are not doing well or get worse. Document Released: 09/24/2005 Document Revised: 09/08/2012 Document Reviewed: 11/29/2011 [facility] Patient Information 2014 [place]. Knee Sprain A knee sprain is a tear in one of the strong, fibrous tissues that connect the bones (ligaments) in your knee. The severity of the sprain depends on how much of the ligament is torn. The tear can be either partial or complete. CAUSES  Often, sprains are a result of a fall or injury. The force of the impact causes the fibers of your ligament to stretch too much. This excess tension causes the fibers of your ligament to tear. SIGNS AND SYMPTOMS  You may have some loss of motion in your knee. Other symptoms include:  Bruising.  Pain in the knee area.  Tenderness of the knee to the touch.  Swelling. DIAGNOSIS  To diagnose a knee sprain, your health care provider will physically examine your knee. Your health care provider may also suggest an X-ray exam of your knee to make sure no bones are broken. TREATMENT  If your ligament is only partially torn, treatment usually involves keeping the knee in a fixed position (immobilization) or bracing your knee for activities that require movement for several weeks. To do this, your health care provider  will apply a bandage, cast, or splint to keep your knee from moving and to support your knee during movement until it heals. For a partially torn ligament, the healing process usually takes 4 6 weeks. If your ligament is completely torn, depending on which ligament it is, you may need surgery to reconnect the ligament to the bone or reconstruct it. After surgery, a cast or splint may be applied and will need to stay on your knee for 4 6 weeks while your ligament heals. HOME CARE INSTRUCTIONS  Keep your injured knee elevated to decrease swelling.  To ease pain and swelling, apply ice to the injured area:  Put ice in a plastic bag.  Place a towel between your skin and the bag.  Leave the ice on for 20 minutes, 2 3 times a day.  Only take medicine for pain as directed by your health care provider.  Do not leave your knee unprotected until pain and stiffness go away (usually 4 6 weeks).  If you have a cast or splint, do not allow it to get wet. If you have been instructed  not to remove it, cover it with a plastic bag when you shower or bathe. Do not swim.  Your health care provider may suggest exercises for you to do during your recovery to prevent or limit permanent weakness and stiffness. SEEK IMMEDIATE MEDICAL CARE IF:  Your cast or splint becomes damaged.  Your pain becomes worse.  You have significant pain, swelling, or numbness below the cast or splint. MAKE SURE YOU:  Understand these instructions.  Will watch your condition.  Will get help right away if you are not doing well or get worse. Document Released: 12/15/2005 Document Revised: 10/05/2013 Document Reviewed: 07/27/2013 [facility] Patient Information 2014 [place]. Ankle Sprain An ankle sprain is an injury to the strong, fibrous tissues (ligaments) that hold the bones of your ankle joint together.  CAUSES An ankle sprain is usually caused by a fall or by twisting your ankle. Ankle sprains most commonly occur  when you step on the outer edge of your foot, and your ankle turns inward. People who participate in sports are more prone to these types of injuries.  SYMPTOMS   Pain in your ankle. The pain may be present at rest or only when you are trying to stand or walk.  Swelling.  Bruising. Bruising may develop immediately or within 1 to 2 days after your injury.  Difficulty standing or walking, particularly when turning corners or changing directions. DIAGNOSIS  Your caregiver will ask you details about your injury and perform a physical exam of your ankle to determine if you have an ankle sprain. During the physical exam, your caregiver will press on and apply pressure to specific areas of your foot and ankle. Your caregiver will try to move your ankle in certain ways. An X-ray exam may be done to be sure a bone was not broken or a ligament did not separate from one of the bones in your ankle (avulsion fracture).  TREATMENT  Certain types of braces can help stabilize your ankle. Your caregiver can make a recommendation for this. Your caregiver may recommend the use of medicine for pain. If your sprain is severe, your caregiver may refer you to a surgeon who helps to restore function to parts of your skeletal system (orthopedist) or a physical therapist. [place] ice to your injury for 1 2 days or as directed by your caregiver. Applying ice helps to reduce inflammation and pain.  Put ice in a plastic bag.  Place a towel between your skin and the bag.  Leave the ice on for 15-20 minutes at a time, every 2 hours while you are awake.  Only take over-the-counter or prescription medicines for pain, discomfort, or fever as directed by your caregiver.  Elevate your injured ankle above the level of your heart as much as possible for 2 3 days.  If your caregiver recommends crutches, use them as instructed. Gradually put weight on the affected ankle. Continue to use crutches or a cane  until you can walk without feeling pain in your ankle.  If you have a plaster splint, wear the splint as directed by your caregiver. Do not rest it on anything harder than a pillow for the first 24 hours. Do not put weight on it. Do not get it wet. You may take it off to take a shower or bath.  You may have been given an elastic bandage to wear around your ankle to provide support. If the elastic bandage is too tight (you have numbness  or tingling in your foot or your foot becomes cold and blue), adjust the bandage to make it comfortable.  If you have an air splint, you may blow more air into it or let air out to make it more comfortable. You may take your splint off at night and before taking a shower or bath. Wiggle your toes in the splint several times per day to decrease swelling. SEEK MEDICAL CARE IF:   You have rapidly increasing bruising or swelling.  Your toes feel extremely cold or you lose feeling in your foot.  Your pain is not relieved with medicine. SEEK IMMEDIATE MEDICAL CARE IF:  Your toes are numb or blue.  You have severe pain that is increasing. MAKE SURE YOU:   Understand these instructions.  Will watch your condition.  Will get help right away if you are not doing well or get worse. Document Released: 12/15/2005 Document Revised: 09/08/2012 Document Reviewed: 12/27/2011 [facility] Patient Information 2014 [place], [place].

## 2014-01-12 NOTE — ED Notes (Signed)
Pt reports she fell down 3-4 steps.  She reports pain on "whole left side".  Tenderness noted to left knee and pain with moving extremity.

## 2014-05-31 ENCOUNTER — Emergency Department (HOSPITAL_BASED_OUTPATIENT_CLINIC_OR_DEPARTMENT_OTHER): Payer: BC Managed Care – PPO

## 2014-05-31 ENCOUNTER — Encounter (HOSPITAL_BASED_OUTPATIENT_CLINIC_OR_DEPARTMENT_OTHER): Payer: Self-pay | Admitting: Emergency Medicine

## 2014-05-31 ENCOUNTER — Emergency Department (HOSPITAL_BASED_OUTPATIENT_CLINIC_OR_DEPARTMENT_OTHER)
Admission: EM | Admit: 2014-05-31 | Discharge: 2014-05-31 | Disposition: A | Discharge status: Home / Self Care | Payer: BC Managed Care – PPO | Attending: Emergency Medicine | Admitting: Emergency Medicine

## 2014-05-31 DIAGNOSIS — S46909A Unspecified injury of unspecified muscle, fascia and tendon at shoulder and upper arm level, unspecified arm, initial encounter: Secondary | ICD-10-CM | POA: Diagnosis present

## 2014-05-31 DIAGNOSIS — Y9289 Other specified places as the place of occurrence of the external cause: Secondary | ICD-10-CM | POA: Diagnosis not present

## 2014-05-31 DIAGNOSIS — Y9389 Activity, other specified: Secondary | ICD-10-CM | POA: Diagnosis not present

## 2014-05-31 DIAGNOSIS — Z791 Long term (current) use of non-steroidal anti-inflammatories (NSAID): Secondary | ICD-10-CM | POA: Diagnosis not present

## 2014-05-31 DIAGNOSIS — S4980XA Other specified injuries of shoulder and upper arm, unspecified arm, initial encounter: Secondary | ICD-10-CM | POA: Diagnosis not present

## 2014-05-31 DIAGNOSIS — IMO0002 Reserved for concepts with insufficient information to code with codable children: Secondary | ICD-10-CM | POA: Insufficient documentation

## 2014-05-31 DIAGNOSIS — W208XXA Other cause of strike by thrown, projected or falling object, initial encounter: Secondary | ICD-10-CM | POA: Insufficient documentation

## 2014-05-31 DIAGNOSIS — S0990XA Unspecified injury of head, initial encounter: Secondary | ICD-10-CM | POA: Diagnosis not present

## 2014-05-31 DIAGNOSIS — Z79899 Other long term (current) drug therapy: Secondary | ICD-10-CM | POA: Insufficient documentation

## 2014-05-31 DIAGNOSIS — J45909 Unspecified asthma, uncomplicated: Secondary | ICD-10-CM | POA: Diagnosis not present

## 2014-05-31 DIAGNOSIS — I10 Essential (primary) hypertension: Secondary | ICD-10-CM | POA: Insufficient documentation

## 2014-05-31 DIAGNOSIS — M25519 Pain in unspecified shoulder: Secondary | ICD-10-CM

## 2014-05-31 DIAGNOSIS — S39012A Strain of muscle, fascia and tendon of lower back, initial encounter: Secondary | ICD-10-CM

## 2014-05-31 MED ORDER — IBUPROFEN 600 MG PO TABS: 600.0000 mg | ORAL_TABLET | Freq: Four times a day (QID) | ORAL | Status: DC | PRN

## 2014-05-31 MED ORDER — ONDANSETRON 4 MG PO TBDP
4.0000 mg | ORAL_TABLET | Freq: Once | ORAL | Status: AC
  Administered 2014-05-31: 4 mg via ORAL

## 2014-05-31 MED ORDER — OXYCODONE-ACETAMINOPHEN 5-325 MG PO TABS: 2.0000 | ORAL_TABLET | ORAL | Status: DC | PRN

## 2014-05-31 MED ORDER — ONDANSETRON 4 MG PO TBDP
ORAL_TABLET | ORAL | Status: AC
  Filled 2014-05-31: qty 1

## 2014-05-31 MED ORDER — ONDANSETRON 4 MG PO TBDP: ORAL_TABLET | ORAL | Status: DC

## 2014-05-31 MED ORDER — OXYCODONE-ACETAMINOPHEN 5-325 MG PO TABS
2.0000 | ORAL_TABLET | Freq: Once | ORAL | Status: AC
  Administered 2014-05-31: 2 via ORAL
  Filled 2014-05-31: qty 2

## 2014-05-31 NOTE — ED Provider Notes (Signed)
CSN: [id number]     Arrival date & time 05/31/14  1456 History   First MD Initiated Contact with Patient 05/31/14 1513     Chief Complaint  Patient presents with  . Shoulder Injury     (Consider location/radiation/quality/duration/timing/severity/associated sxs/prior Treatment) HPI Comments: Patient complains of pain to her left shoulder and low back. She states she was moving furniture yesterday and not on after that she started having some achiness in her left shoulder and the right side of her low back. She has no radiation down her legs. She has no radiation down her left arm. She has no numbness or weakness to her extremities. She denies any fall or other injuries. She also states that a ceramic doll fell on her head while she was moving stuff. There is no loss of consciousness. She's had no nausea or vomiting. She has some tenderness to the top of her head related to that.  Patient is a 48 y.o. female presenting with shoulder injury.  Shoulder Injury Pertinent negatives include no headaches.    Past Medical History  Diagnosis Date  . Hypertension   . Anemia    Past Surgical History  Procedure Laterality Date  . Myomectomy     Family History  Problem Relation Age of Onset  . Heart attack Mother     age "62-40's"  . Heart attack Sister     age "68-50's:   History  Substance Use Topics  . Smoking status: Never Smoker   . Smokeless tobacco: Not on file  . Alcohol Use: No   OB History   Grav Para Term Preterm Abortions TAB SAB Ect Mult Living                 Review of Systems  Constitutional: Negative for fever.  Gastrointestinal: Negative for nausea and vomiting.  Musculoskeletal: Positive for arthralgias and back pain. Negative for joint swelling and neck pain.  Skin: Negative for wound.  Neurological: Negative for weakness, numbness and headaches.      Allergies  Codeine  Home Medications   Prior to Admission medications   Medication Sig Start Date End  Date Taking? Authorizing Provider  hydrochlorothiazide (MICROZIDE) 12.5 MG capsule Take 12.5 mg by mouth daily.    Historical Provider, MD  ibuprofen (ADVIL,MOTRIN) 200 MG tablet Take 400 mg by mouth every 6 (six) hours as needed. Patient used this medication for chest discomfort.    Historical Provider, MD  ibuprofen (ADVIL,MOTRIN) 600 MG tablet Take 1 tablet (600 mg total) by mouth every 6 (six) hours as needed. 05/31/14   Malvin Johns, MD  ibuprofen (ADVIL,MOTRIN) 800 MG tablet Take 1 tablet (800 mg total) by mouth 3 (three) times daily. 08/18/13   April K Palumbo-Rasch, MD  ibuprofen (ADVIL,MOTRIN) 800 MG tablet Take 1 tablet (800 mg total) by mouth every 8 (eight) hours as needed for mild pain. 01/12/14   Kristen N Ward, DO  IRON CR PO Take 1 tablet by mouth daily.    Historical Provider, MD  methocarbamol (ROBAXIN) 500 MG tablet Take 1 tablet (500 mg total) by mouth 2 (two) times daily. 08/18/13   April Alfonso Patten, MD  NAPROXEN PO Take by mouth.    Historical Provider, MD  ondansetron (ZOFRAN ODT) 4 MG disintegrating tablet 4mg  ODT q4 hours prn nausea/vomit 05/31/14   Malvin Johns, MD  ondansetron (ZOFRAN) 4 MG tablet Take 1 tablet (4 mg total) by mouth every 6 (six) hours. 01/12/14   [place], DO  oxyCODONE-acetaminophen (PERCOCET) 5-325 MG per tablet Take 1 tablet by mouth every 6 (six) hours as needed for moderate pain. 12/20/13   Blanchard Kelch, MD  oxyCODONE-acetaminophen (PERCOCET) 5-325 MG per tablet Take 2 tablets by mouth every 4 (four) hours as needed. 05/31/14   Malvin Johns, MD  oxyCODONE-acetaminophen (PERCOCET/ROXICET) 5-325 MG per tablet Take 1 tablet by mouth every 4 (four) hours as needed. 01/12/14   Kristen N Ward, DO  PRESCRIPTION MEDICATION Apply 1 application topically 2 (two) times daily. Ointment for dry skin on face     Historical Provider, MD  traMADol (ULTRAM) 50 MG tablet Take 1 tablet (50 mg total) by mouth every 6 (six) hours as needed for pain. 08/18/13    April K Palumbo-Rasch, MD   BP 153/93  Pulse 73  Temp(Src) 98.3 F (36.8 C) (Oral)  Resp 18  Ht 5\' 8"  (1.727 m)  Wt 200 lb (90.719 kg)  BMI 30.42 kg/m2  SpO2 100%  LMP 05/29/2014 Physical Exam  Constitutional: She is oriented to person, place, and time. She appears well-developed and well-nourished.  HENT:  Head: Normocephalic and atraumatic.  Neck: Normal range of motion. Neck supple.  No pain along the cervical thoracic or lumbosacral spine  Cardiovascular: Normal rate.   Pulmonary/Chest: Effort normal.  Musculoskeletal: She exhibits tenderness. She exhibits no edema.  Positive tenderness to the right paraspinal muscles. There is no pain along the spine. There's also tenderness to the anterior left shoulder. The majority of the tenderness seems to be in the area of the biceps tendon insertion. She has no significant pain with external rotation or abduction of the shoulder. There is some tenderness around the a.c. joint she is neurovascularly intact distally  Neurological: She is alert and oriented to person, place, and time.  Skin: Skin is warm and dry.  Psychiatric: She has a normal mood and affect.    ED Course  Procedures (including critical care time) Labs Review Labs Reviewed - No data to display  Imaging Review Dg Shoulder Left  05/31/2014   CLINICAL DATA:  Left shoulder pain  EXAM: LEFT SHOULDER - 2+ VIEW  COMPARISON:  01/12/2001  FINDINGS: Glenohumeral joint is intact. No evidence of scapular fracture or humeral fracture. The acromioclavicular joint is intact.  IMPRESSION: No acute osseous abnormality.   Electronically Signed   By: Suzy Bouchard M.D.   On: 05/31/2014 16:08     EKG Interpretation None      MDM   Final diagnoses:  Back strain  Shoulder pain  Head injury    Patient is placed in a shoulder sling. She was given a prescription for pain medications to use at home. She was given a referral to follow with Dr. Barbaraann Barthel if her symptoms are not  improving. She has some minor tenderness to her head where it bit ceramic piece fell on her head. She has no other symptoms that would be concerning for an intra-cranial hemorrhage. There is no indication for head CT at this point. She was given return precautions.    Malvin Johns, MD 05/31/14 [phone]

## 2014-05-31 NOTE — ED Notes (Signed)
MD at bedside. 

## 2014-05-31 NOTE — Discharge Instructions (Signed)
Head Injury, Adult °You have received a head injury. It does not appear serious at this time. Headaches and vomiting are common following head injury. It should be easy to awaken from sleeping. Sometimes it is necessary for you to stay in the emergency department for a while for observation. Sometimes admission to the hospital may be needed. After injuries such as yours, most problems occur within the first 24 hours, but side effects may occur up to 7 10 days after the injury. It is important for you to carefully monitor your condition and contact your health care provider or seek immediate medical care if there is a change in your condition. °WHAT ARE THE TYPES OF HEAD INJURIES? °Head injuries can be as minor as a bump. Some head injuries can be more severe. More severe head injuries include: °· A jarring injury to the brain (concussion). °· A bruise of the brain (contusion). This mean there is bleeding in the brain that can cause swelling. °· A cracked skull (skull fracture). °· Bleeding in the brain that collects, clots, and forms a bump (hematoma). °WHAT CAUSES A HEAD INJURY? °A serious head injury is most likely to happen to someone who is in a car wreck and is not wearing a seat belt. Other causes of major head injuries include bicycle or motorcycle accidents, sports injuries, and falls. °HOW ARE HEAD INJURIES DIAGNOSED? °A complete history of the event leading to the injury and your current symptoms will be helpful in diagnosing head injuries. Many times, pictures of the brain, such as CT or MRI are needed to see the extent of the injury. Often, an overnight hospital stay is necessary for observation.  °WHEN SHOULD I SEEK IMMEDIATE MEDICAL CARE?  °You should get help right away if: °· You have confusion or drowsiness. °· You feel sick to your stomach (nauseous) or have continued, forceful vomiting. °· You have dizziness or unsteadiness that is getting worse. °· You have severe, continued headaches not  relieved by medicine. Only take over-the-counter or prescription medicines for pain, fever, or discomfort as directed by your health care provider. °· You do not have normal function of the arms or legs or are unable to walk. °· You notice changes in the black spots in the center of the colored part of your eye (pupil). °· You have a clear or bloody fluid coming from your nose or ears. °· You have a loss of vision. °During the next 24 hours after the injury, you must stay with someone who can watch you for the warning signs. This person should contact local emergency services (911 in the [place] if you have seizures, you become unconscious, or you are unable to wake up. °HOW CAN I PREVENT A HEAD INJURY IN THE FUTURE? °The most important factor for preventing major head injuries is avoiding motor vehicle accidents.  To minimize the potential for damage to your head, it is crucial to wear seat belts while riding in motor vehicles. Wearing helmets while bike riding and playing collision sports (like football) is also helpful. Also, avoiding dangerous activities around the house will further help reduce your risk of head injury.  °WHEN CAN I RETURN TO NORMAL ACTIVITIES AND ATHLETICS? °You should be reevaluated by your health care provider before returning to these activities. If you have any of the following symptoms, you should not return to activities or contact sports until 1 week after the symptoms have stopped: °· Persistent headache. °· Dizziness or vertigo. °· Poor attention and concentration. °·   Confusion.  Memory problems.  Nausea or vomiting.  Fatigue or tire easily.  Irritability.  Intolerant of bright lights or loud noises.  Anxiety or depression.  Disturbed sleep. MAKE SURE YOU:   Understand these instructions.  Will watch your condition.  Will get help right away if you are not doing well or get worse. Document Released: 12/15/2005 Document Revised: 10/05/2013 Document Reviewed:  08/22/2013 [facility] Patient Information 2014 [place].  Shoulder Pain The shoulder is the joint that connects your arms to your body. The bones that form the shoulder joint include the upper arm bone (humerus), the shoulder blade (scapula), and the collarbone (clavicle). The top of the humerus is shaped like a ball and fits into a rather flat socket on the scapula (glenoid cavity). A combination of muscles and strong, fibrous tissues that connect muscles to bones (tendons) support your shoulder joint and hold the ball in the socket. Small, fluid-filled sacs (bursae) are located in different areas of the joint. They act as cushions between the bones and the overlying soft tissues and help reduce friction between the gliding tendons and the bone as you move your arm. Your shoulder joint allows a wide range of motion in your arm. This range of motion allows you to do things like scratch your back or throw a ball. However, this range of motion also makes your shoulder more prone to pain from overuse and injury. Causes of shoulder pain can originate from both injury and overuse and usually can be grouped in the following four categories:  Redness, swelling, and pain (inflammation) of the tendon (tendinitis) or the bursae (bursitis).  Instability, such as a dislocation of the joint.  Inflammation of the joint (arthritis).  Broken bone (fracture). HOME CARE INSTRUCTIONS   Apply ice to the sore area.  Put ice in a plastic bag.  Place a towel between your skin and the bag.  Leave the ice on for 15-20 minutes, 03-04 times per day for the first 2 days.  Stop using cold packs if they do not help with the pain.  If you have a shoulder sling or immobilizer, wear it as long as your caregiver instructs. Only remove it to shower or bathe. Move your arm as little as possible, but keep your hand moving to prevent swelling.  Squeeze a soft ball or foam pad as much as possible to help prevent  swelling.  Only take over-the-counter or prescription medicines for pain, discomfort, or fever as directed by your caregiver. SEEK MEDICAL CARE IF:   Your shoulder pain increases, or new pain develops in your arm, hand, or fingers.  Your hand or fingers become cold and numb.  Your pain is not relieved with medicines. SEEK IMMEDIATE MEDICAL CARE IF:   Your arm, hand, or fingers are numb or tingling.  Your arm, hand, or fingers are significantly swollen or turn white or blue. MAKE SURE YOU:   Understand these instructions.  Will watch your condition.  Will get help right away if you are not doing well or get worse. Document Released: 09/24/2005 Document Revised: 09/08/2012 Document Reviewed: 11/29/2011 [facility] Patient Information 2014 [place].

## 2014-05-31 NOTE — ED Notes (Signed)
Pt c/o left shoulder injury and lower back pain from moving furniture yesterday

## 2014-06-01 ENCOUNTER — Telehealth (HOSPITAL_BASED_OUTPATIENT_CLINIC_OR_DEPARTMENT_OTHER): Payer: Self-pay | Admitting: *Deleted

## 2014-06-01 NOTE — ED Notes (Signed)
Patient called to state that she has had hives and itching since being given percocet here yesterday.  States she took one benadryl last night with no help.  States she continues to be covered with hives.  States she wants to have atarax and a cream called in for her hives.  Discussed chart with Dr. Stark Jock.  Prescription called in for Atarax 25 mg po q 6 hrs prn itching.  Call placed to pt with update on prescription.  No cream was recommended per Dr. Stark Jock for the hives.

## 2014-06-05 ENCOUNTER — Ambulatory Visit (INDEPENDENT_AMBULATORY_CARE_PROVIDER_SITE_OTHER): Payer: BC Managed Care – PPO | Admitting: Family Medicine

## 2014-06-05 ENCOUNTER — Encounter: Payer: Self-pay | Admitting: Family Medicine

## 2014-06-05 VITALS — BP 144/96 | HR 75 | Ht 68.0 in | Wt 196.0 lb

## 2014-06-05 DIAGNOSIS — M25519 Pain in unspecified shoulder: Secondary | ICD-10-CM

## 2014-06-05 DIAGNOSIS — S43006A Unspecified dislocation of unspecified shoulder joint, initial encounter: Secondary | ICD-10-CM

## 2014-06-05 DIAGNOSIS — M25562 Pain in left knee: Secondary | ICD-10-CM

## 2014-06-05 DIAGNOSIS — M25512 Pain in left shoulder: Secondary | ICD-10-CM

## 2014-06-05 DIAGNOSIS — S43005A Unspecified dislocation of left shoulder joint, initial encounter: Secondary | ICD-10-CM

## 2014-06-05 DIAGNOSIS — M25569 Pain in unspecified knee: Secondary | ICD-10-CM

## 2014-06-05 DIAGNOSIS — S143XXA Injury of brachial plexus, initial encounter: Secondary | ICD-10-CM

## 2014-06-05 MED ORDER — METHYLPREDNISOLONE ACETATE 40 MG/ML IJ SUSP
40.0000 mg | Freq: Once | INTRAMUSCULAR | Status: AC
  Administered 2014-06-05: 40 mg via INTRA_ARTICULAR

## 2014-06-05 NOTE — Patient Instructions (Signed)
You have strained your rotator cuff. Try to avoid painful activities (overhead activities, lifting with extended arm) as much as possible. Aleve 2 tabs twice a day with food OR ibuprofen 3 tabs three times a day with food for pain and inflammation. Can take tylenol in addition to this. Subacromial injection may be beneficial to help with pain and to decrease inflammation - you were given this today. Start physical therapy with transition to home exercise program. Do home exercise program with theraband and scapular stabilization exercises daily - these are very important for long term relief even if an injection was given. If not improving at follow-up we will consider further imaging, physical therapy and/or nitro patches.  You have flared the arthritis in your knee, less likely sustained a meniscus tear. Both are treated similarly. Take tylenol 500mg  1-2 tabs three times a day for pain. Aleve 1-2 tabs twice a day with food Glucosamine sulfate 750mg  twice a day is a supplement that may help. Capsaicin topically up to four times a day may also help with pain. Cortisone injections are an option  - you were given one today. It's important that you continue to stay active. Straight leg raises, knee extensions 3 sets of 10 once a day (add ankle weight if these become too easy). Start physical therapy to strengthen muscles around the joint that hurts to take pressure off of the joint itself. Shoe inserts with good arch support may be helpful. Heat or ice 15 minutes at a time 3-4 times a day as needed to help with pain. Follow up with me in 1 month for both issues.

## 2014-06-08 ENCOUNTER — Encounter: Payer: Self-pay | Admitting: Family Medicine

## 2014-06-08 DIAGNOSIS — M25562 Pain in left knee: Secondary | ICD-10-CM | POA: Insufficient documentation

## 2014-06-08 DIAGNOSIS — S4992XA Unspecified injury of left shoulder and upper arm, initial encounter: Secondary | ICD-10-CM | POA: Insufficient documentation

## 2014-06-08 NOTE — Assessment & Plan Note (Signed)
2/2 strain.  Start physical therapy.  Subacromial injection given.  Tylenol, nsaids, avoid reaching and overhead activities as much as possible.  Consider further imaging, nitro if not improving.

## 2014-06-08 NOTE — Progress Notes (Signed)
Patient ID: Kelli Khan, female   DOB: 05/28/1966, 48 y.o.   MRN: [id number]  PCP: Houston Siren, MD  Subjective:   HPI: Patient is a 48 y.o. female here for left shoulder and knee pain.  Patient reports back in January she fell down about 4-5 steps. Unsure how she injured her shoulder doing this but her knee was caught in stair lift as she fell down. Couldn't bear weight for a few months after this injury. Last week when moving some things at home thinks she may have hurt left shoulder again without an acute injury. + swelling of knee with some catching. No giving out.  Past Medical History  Diagnosis Date  . Hypertension   . Anemia     Current Outpatient Prescriptions on File Prior to Visit  Medication Sig Dispense Refill  . hydrochlorothiazide (MICROZIDE) 12.5 MG capsule Take 12.5 mg by mouth daily.      Marland Kitchen ibuprofen (ADVIL,MOTRIN) 600 MG tablet Take 1 tablet (600 mg total) by mouth every 6 (six) hours as needed.  30 tablet  0  . IRON CR PO Take 1 tablet by mouth daily.      . ondansetron (ZOFRAN ODT) 4 MG disintegrating tablet 4mg  ODT q4 hours prn nausea/vomit  4 tablet  0  . oxyCODONE-acetaminophen (PERCOCET) 5-325 MG per tablet Take 2 tablets by mouth every 4 (four) hours as needed.  20 tablet  0   No current facility-administered medications on file prior to visit.    Past Surgical History  Procedure Laterality Date  . Myomectomy      Allergies  Allergen Reactions  . Codeine Itching  . Percocet [Oxycodone-Acetaminophen]     History   Social History  . Marital Status: Single    Spouse Name: N/A    Number of Children: N/A  . Years of Education: N/A   Occupational History  . Not on file.   Social History Main Topics  . Smoking status: Never Smoker   . Smokeless tobacco: Not on file  . Alcohol Use: No  . Drug Use: No  . Sexual Activity: Not on file   Other Topics Concern  . Not on file   Social History Narrative   Works as a Environmental education officer, which  requires some heavy lifting.    Family History  Problem Relation Age of Onset  . Heart attack Mother     age "93-40's"  . Heart attack Sister     age "87-50's:    BP 144/96  Pulse 75  Ht 5\' 8"  (1.727 m)  Wt 196 lb (88.905 kg)  BMI 29.81 kg/m2  LMP 05/29/2014  Review of Systems: See HPI above.    Objective:  Physical Exam:  Gen: NAD  Left shoulder: No swelling, ecchymoses.  No gross deformity. Mild anterior tenderness. ROM 120 degrees flexion, 90 abduction, full ER. Positive Hawkins, Neers. Negative Speeds, Yergasons. Strength 5/5 with empty can and resisted internal/external rotation.  Pain empty can. Negative apprehension. NV intact distally.  L knee: No gross deformity, ecchymoses, swelling. TTP medial joint line, mild post patellar facets. FROM. Negative ant/post drawers. Negative valgus/varus testing. Negative lachmanns. Negative mcmurrays, apleys, patellar apprehension. NV intact distally.  MSK u/s:  No evidence supraspinatus, infraspinatus, subscapularis, biceps tendon tears in long and trans views left shoulder.    Assessment & Plan:  1. Left shoulder injury - 2/2 strain.  Start physical therapy.  Subacromial injection given.  Tylenol, nsaids, avoid reaching and overhead activities as much as possible.  Consider further imaging, nitro if not improving.  2. 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  Start with tylenol, nsaids, glucosamine, capsaicin.  Intraarticular injection given today.  HEP reviewed.  Heat/ice as needed, discussed inserts.  Follow up with me in 1 month for both issues.  After informed written consent, patient was seated on exam table. Left knee was prepped with alcohol swab and utilizing anteromedial approach, patient's left knee was injected intraarticularly with 3:1 marcaine: depomedrol. Patient tolerated the procedure well without immediate complications.

## 2014-06-08 NOTE — Assessment & Plan Note (Signed)
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  Start with tylenol, nsaids, glucosamine, capsaicin.  Intraarticular injection given today.  HEP reviewed.  Heat/ice as needed, discussed inserts.  Follow up with me in 1 month for both issues.  After informed written consent, patient was seated on exam table. Left knee was prepped with alcohol swab and utilizing anteromedial approach, patient's left knee was injected intraarticularly with 3:1 marcaine: depomedrol. Patient tolerated the procedure well without immediate complications.

## 2014-07-04 ENCOUNTER — Ambulatory Visit: Payer: No Typology Code available for payment source | Admitting: Family Medicine

## 2014-08-31 ENCOUNTER — Encounter (HOSPITAL_BASED_OUTPATIENT_CLINIC_OR_DEPARTMENT_OTHER): Payer: Self-pay | Admitting: Emergency Medicine

## 2014-08-31 ENCOUNTER — Emergency Department (HOSPITAL_BASED_OUTPATIENT_CLINIC_OR_DEPARTMENT_OTHER): Payer: BC Managed Care – PPO

## 2014-08-31 ENCOUNTER — Emergency Department (HOSPITAL_BASED_OUTPATIENT_CLINIC_OR_DEPARTMENT_OTHER)
Admission: EM | Admit: 2014-08-31 | Discharge: 2014-08-31 | Disposition: A | Discharge status: Home / Self Care | Payer: BC Managed Care – PPO | Attending: Emergency Medicine | Admitting: Emergency Medicine

## 2014-08-31 DIAGNOSIS — I10 Essential (primary) hypertension: Secondary | ICD-10-CM | POA: Insufficient documentation

## 2014-08-31 DIAGNOSIS — M5380 Other specified dorsopathies, site unspecified: Secondary | ICD-10-CM | POA: Insufficient documentation

## 2014-08-31 DIAGNOSIS — Z79899 Other long term (current) drug therapy: Secondary | ICD-10-CM | POA: Diagnosis not present

## 2014-08-31 DIAGNOSIS — M25562 Pain in left knee: Secondary | ICD-10-CM

## 2014-08-31 DIAGNOSIS — M25519 Pain in unspecified shoulder: Secondary | ICD-10-CM | POA: Diagnosis present

## 2014-08-31 DIAGNOSIS — M25569 Pain in unspecified knee: Secondary | ICD-10-CM | POA: Insufficient documentation

## 2014-08-31 DIAGNOSIS — M545 Low back pain, unspecified: Secondary | ICD-10-CM | POA: Diagnosis not present

## 2014-08-31 DIAGNOSIS — D649 Anemia, unspecified: Secondary | ICD-10-CM | POA: Insufficient documentation

## 2014-08-31 DIAGNOSIS — M25512 Pain in left shoulder: Secondary | ICD-10-CM

## 2014-08-31 LAB — URINALYSIS, ROUTINE W REFLEX MICROSCOPIC
Bilirubin Urine: NEGATIVE
GLUCOSE, UA: NEGATIVE mg/dL
Hgb urine dipstick: NEGATIVE
Ketones, ur: NEGATIVE mg/dL
LEUKOCYTES UA: NEGATIVE
NITRITE: NEGATIVE
PH: 5.5 (ref 5.0–8.0)
Protein, ur: NEGATIVE mg/dL
Specific Gravity, Urine: 1.023 (ref 1.005–1.030)
UROBILINOGEN UA: 1 mg/dL (ref 0.0–1.0)

## 2014-08-31 MED ORDER — IBUPROFEN 800 MG PO TABS
800.0000 mg | ORAL_TABLET | Freq: Once | ORAL | Status: AC
  Administered 2014-08-31: 800 mg via ORAL
  Filled 2014-08-31: qty 1

## 2014-08-31 MED ORDER — ORPHENADRINE CITRATE ER 100 MG PO TB12: 100.0000 mg | ORAL_TABLET | Freq: Two times a day (BID) | ORAL | Status: DC

## 2014-08-31 MED ORDER — NAPROXEN 500 MG PO TABS: 500.0000 mg | ORAL_TABLET | Freq: Two times a day (BID) | ORAL | Status: DC

## 2014-08-31 NOTE — ED Notes (Signed)
Patient states that she was helping her sister move and now has lower back pain and left shoulder pain. The patient also reports that she is having urinary frequency

## 2014-08-31 NOTE — Discharge Instructions (Signed)
Apply ice to sore areas - rotate ice pack around.  Rotator Cuff Tendinitis  Rotator cuff tendinitis is inflammation of the tough, cord-like bands that connect muscle to bone (tendons) in your rotator cuff. Your rotator cuff is the collection of all the muscles and tendons that connect your arm to your shoulder. Your rotator cuff holds the head of your upper arm bone (humerus) in the cup (fossa) of your shoulder blade (scapula). CAUSES Rotator cuff tendinitis is usually caused by overusing the joint involved.  SIGNS AND SYMPTOMS  Deep ache in the shoulder also felt on the outside upper arm over the shoulder muscle.  Point tenderness over the area that is injured.  Pain comes on gradually and becomes worse with lifting the arm to the side (abduction) or turning it inward (internal rotation).  May lead to a chronic tear: When a rotator cuff tendon becomes inflamed, it runs the risk of losing its blood supply, causing some tendon fibers to die. This increases the risk that the tendon can fray and partially or completely tear. DIAGNOSIS Rotator cuff tendinitis is diagnosed by taking a medical history, performing a physical exam, and reviewing results of imaging exams. The medical history is useful to help determine the type of rotator cuff injury. The physical exam will include looking at the injured shoulder, feeling the injured area, and watching you do range-of-motion exercises. X-ray exams are typically done to rule out other causes of shoulder pain, such as fractures. MRI is the imaging exam usually used for significant shoulder injuries. Sometimes a dye study called CT arthrogram is done, but it is not as widely used as MRI. In some institutions, special ultrasound tests may also be used to aid in the diagnosis. TREATMENT  Less Severe Cases  Use of a sling to rest the shoulder for a short period of time. Prolonged use of the sling can cause stiffness, weakness, and loss of motion of the shoulder  joint.  Anti-inflammatory medicines, such as ibuprofen or naproxen sodium, may be prescribed. More Severe Cases  Physical therapy.  Use of steroid injections into the shoulder joint.  Surgery. HOME CARE INSTRUCTIONS   Use a sling or splint until the pain decreases. Prolonged use of the sling can cause stiffness, weakness, and loss of motion of the shoulder joint.  Apply ice to the injured area:  Put ice in a plastic bag.  Place a towel between your skin and the bag.  Leave the ice on for 20 minutes, 2-3 times a day.  Try to avoid use other than gentle range of motion while your shoulder is painful. Use the shoulder and exercise only as directed by your health care provider. Stop exercises or range of motion if pain or discomfort increases, unless directed otherwise by your health care provider.  Only take over-the-counter or prescription medicines for pain, discomfort, or fever as directed by your health care provider.  If you were given a shoulder sling and straps (immobilizer), do not remove it except as directed, or until you see a health care provider for a follow-up exam. If you need to remove it, move your arm as little as possible or as directed.  You may want to sleep on several pillows at night to lessen swelling and pain. SEEK IMMEDIATE MEDICAL CARE IF:   Your shoulder pain increases or new pain develops in your arm, hand, or fingers and is not relieved with medicines.  You have new, unexplained symptoms, especially increased numbness in the hands or  loss of strength.  You develop any worsening of the problems that brought you in for care.  Your arm, hand, or fingers are numb or tingling.  Your arm, hand, or fingers are swollen, painful, or turn white or blue. MAKE SURE YOU:  Understand these instructions.  Will watch your condition.  Will get help right away if you are not doing well or get worse. Document Released: 03/06/2004 Document Revised: 10/05/2013  Document Reviewed: 07/27/2013 [facility] Patient Information 2015 [place], [place]. This information is not intended to replace advice given to you by your health care provider. Make sure you discuss any questions you have with your health care provider.  Back Pain, Adult Low back pain is very common. About 1 in 5 people have back pain.The cause of low back pain is rarely dangerous. The pain often gets better over time.About half of people with a sudden onset of back pain feel better in just 2 weeks. About 8 in 10 people feel better by 6 weeks.  CAUSES Some common causes of back pain include:  Strain of the muscles or ligaments supporting the spine.  Wear and tear (degeneration) of the spinal discs.  Arthritis.  Direct injury to the back. DIAGNOSIS Most of the time, the direct cause of low back pain is not known.However, back pain can be treated effectively even when the exact cause of the pain is unknown.Answering your caregiver's questions about your overall health and symptoms is one of the most accurate ways to make sure the cause of your pain is not dangerous. If your caregiver needs more information, he or she may order lab work or imaging tests (X-rays or MRIs).However, even if imaging tests show changes in your back, this usually does not require surgery. HOME CARE INSTRUCTIONS For many people, back pain returns.Since low back pain is rarely dangerous, it is often a condition that people can learn to [facility] their own.   Remain active. It is stressful on the back to sit or stand in one place. Do not sit, drive, or stand in one place for more than 30 minutes at a time. Take short walks on level surfaces as soon as pain allows.Try to increase the length of time you walk each day.  Do not stay in bed.Resting more than 1 or 2 days can delay your recovery.  Do not avoid exercise or work.Your body is made to move.It is not dangerous to be active, even though your back may  hurt.Your back will likely heal faster if you return to being active before your pain is gone.  Pay attention to your body when you bend and lift. Many people have less discomfortwhen lifting if they bend their knees, keep the load close to their bodies,and avoid twisting. Often, the most comfortable positions are those that put less stress on your recovering back.  Find a comfortable position to sleep. Use a firm mattress and lie on your side with your knees slightly bent. If you lie on your back, put a pillow under your knees.  Only take over-the-counter or prescription medicines as directed by your caregiver. Over-the-counter medicines to reduce pain and inflammation are often the most helpful.Your caregiver may prescribe muscle relaxant drugs.These medicines help dull your pain so you can more quickly return to your normal activities and healthy exercise.  Put ice on the injured area.  Put ice in a plastic bag.  Place a towel between your skin and the bag.  Leave the ice on for 15-20 minutes, 03-04  times a day for the first 2 to 3 days. After that, ice and heat may be alternated to reduce pain and spasms.  Ask your caregiver about trying back exercises and gentle massage. This may be of some benefit.  Avoid feeling anxious or stressed.Stress increases muscle tension and can worsen back pain.It is important to recognize when you are anxious or stressed and learn ways to manage it.Exercise is a great option. SEEK MEDICAL CARE IF:  You have pain that is not relieved with rest or medicine.  You have pain that does not improve in 1 week.  You have new symptoms.  You are generally not feeling well. SEEK IMMEDIATE MEDICAL CARE IF:   You have pain that radiates from your back into your legs.  You develop new bowel or bladder control problems.  You have unusual weakness or numbness in your arms or legs.  You develop nausea or vomiting.  You develop abdominal pain.  You  feel faint. Document Released: 12/15/2005 Document Revised: 06/15/2012 Document Reviewed: 04/18/2014 [facility] Patient Information 2015 [place], [place]. This information is not intended to replace advice given to you by your health care provider. Make sure you discuss any questions you have with your health care provider.  Osteoarthritis Osteoarthritis is a disease that causes soreness and inflammation of a joint. It occurs when the cartilage at the affected joint wears down. Cartilage acts as a cushion, covering the ends of bones where they meet to form a joint. Osteoarthritis is the most common form of arthritis. It often occurs in older people. The joints affected most often by this condition include those in the:  Ends of the fingers.  Thumbs.  Neck.  Lower back.  Knees.  Hips. CAUSES  Over time, the cartilage that covers the ends of bones begins to wear away. This causes bone to rub on bone, producing pain and stiffness in the affected joints.  RISK FACTORS Certain factors can increase your chances of having osteoarthritis, including:  Older age.  Excessive body weight.  Overuse of joints.  Previous joint injury. SIGNS AND SYMPTOMS   Pain, swelling, and stiffness in the joint.  Over time, the joint may lose its normal shape.  Small deposits of bone (osteophytes) may grow on the edges of the joint.  Bits of bone or cartilage can break off and float inside the joint space. This may cause more pain and damage. DIAGNOSIS  Your health care provider will do a physical exam and ask about your symptoms. Various tests may be ordered, such as:  X-rays of the affected joint.  An MRI scan.  Blood tests to rule out other types of arthritis.  Joint fluid tests. This involves using a needle to draw fluid from the joint and examining the fluid under a microscope. TREATMENT  Goals of treatment are to control pain and improve joint function. Treatment plans may include:  A  prescribed exercise program that allows for rest and joint relief.  A weight control plan.  Pain relief techniques, such as:  Properly applied heat and cold.  Electric pulses delivered to nerve endings under the skin (transcutaneous electrical nerve stimulation [TENS]).  Massage.  Certain nutritional supplements.  Medicines to control pain, such as:  Acetaminophen.  Nonsteroidal anti-inflammatory drugs (NSAIDs), such as naproxen.  Narcotic or central-acting agents, such as tramadol.  Corticosteroids. These can be given orally or as an injection.  Surgery to reposition the bones and relieve pain (osteotomy) or to remove loose pieces of bone and  cartilage. Joint replacement may be needed in advanced states of osteoarthritis. HOME CARE INSTRUCTIONS   Take medicines only as directed by your health care provider.  Maintain a healthy weight. Follow your health care provider's instructions for weight control. This may include dietary instructions.  Exercise as directed. Your health care provider can recommend specific types of exercise. These may include:  Strengthening exercises. These are done to strengthen the muscles that support joints affected by arthritis. They can be performed with weights or with exercise bands to add resistance.  Aerobic activities. These are exercises, such as brisk walking or low-impact aerobics, that get your heart pumping.  Range-of-motion activities. These keep your joints limber.  Balance and agility exercises. These help you maintain daily living skills.  Rest your affected joints as directed by your health care provider.  Keep all follow-up visits as directed by your health care provider. SEEK MEDICAL CARE IF:   Your skin turns red.  You develop a rash in addition to your joint pain.  You have worsening joint pain.  You have a fever along with joint or muscle aches. SEEK IMMEDIATE MEDICAL CARE IF:  You have a significant loss of  weight or appetite.  You have night sweats. [place] of Arthritis and Musculoskeletal and Skin Diseases: www.niams.SouthExposed.es  [employer] on Aging: http://kim-miller.com/  American College of Rheumatology: www.rheumatology.org Document Released: 12/15/2005 Document Revised: 05/01/2014 Document Reviewed: 08/22/2013 [facility] Patient Information 2015 [place], [place]. This information is not intended to replace advice given to you by your health care provider. Make sure you discuss any questions you have with your health care provider.  Naproxen and naproxen sodium oral immediate-release tablets What is this medicine? NAPROXEN (na PROX en) is a non-steroidal anti-inflammatory drug (NSAID). It is used to reduce swelling and to treat pain. This medicine may be used for dental pain, headache, or painful monthly periods. It is also used for painful joint and muscular problems such as arthritis, tendinitis, bursitis, and gout. This medicine may be used for other purposes; ask your health care provider or pharmacist if you have questions. COMMON BRAND NAME(S): Aflaxen, Aleve, Aleve Arthritis, All Day Relief, Anaprox, Anaprox DS, Naprosyn What should I tell my health care provider before I take this medicine? They need to know if you have any of these conditions: -asthma -cigarette smoker -drink more than 3 alcohol containing drinks a day -heart disease or circulation problems such as heart failure or leg edema (fluid retention) -high blood pressure -kidney disease -liver disease -stomach bleeding or ulcers -an unusual or allergic reaction to naproxen, aspirin, other NSAIDs, other medicines, foods, dyes, or preservatives -pregnant or trying to get pregnant -breast-feeding How should I use this medicine? Take this medicine by mouth with a glass of water. Follow the directions on the prescription label. Take it with food if your stomach gets upset. Try to not lie  down for at least 10 minutes after you take it. Take your medicine at regular intervals. Do not take your medicine more often than directed. Long-term, continuous use may increase the risk of heart attack or stroke. A special MedGuide will be given to you by the pharmacist with each prescription and refill. Be sure to read this information carefully each time. Talk to your pediatrician regarding the use of this medicine in children. Special care may be needed. Overdosage: If you think you have taken too much of this medicine contact a poison control center or emergency room at once.  NOTE: This medicine is only for you. Do not share this medicine with others. What if I miss a dose? If you miss a dose, take it as soon as you can. If it is almost time for your next dose, take only that dose. Do not take double or extra doses. What may interact with this medicine? -alcohol -aspirin -cidofovir -diuretics -lithium -methotrexate -other drugs for inflammation like ketorolac or prednisone -pemetrexed -probenecid -warfarin This list may not describe all possible interactions. Give your health care provider a list of all the medicines, herbs, non-prescription drugs, or dietary supplements you use. Also tell them if you smoke, drink alcohol, or use illegal drugs. Some items may interact with your medicine. What should I watch for while using this medicine? Tell your doctor or health care professional if your pain does not get better. Talk to your doctor before taking another medicine for pain. Do not treat yourself. This medicine does not prevent heart attack or stroke. In fact, this medicine may increase the chance of a heart attack or stroke. The chance may increase with longer use of this medicine and in people who have heart disease. If you take aspirin to prevent heart attack or stroke, talk with your doctor or health care professional. Do not take other medicines that contain aspirin, ibuprofen, or  naproxen with this medicine. Side effects such as stomach upset, nausea, or ulcers may be more likely to occur. Many medicines available without a prescription should not be taken with this medicine. This medicine can cause ulcers and bleeding in the stomach and intestines at any time during treatment. Do not smoke cigarettes or drink alcohol. These increase irritation to your stomach and can make it more susceptible to damage from this medicine. Ulcers and bleeding can happen without warning symptoms and can cause death. You may get drowsy or dizzy. Do not drive, use machinery, or do anything that needs mental alertness until you know how this medicine affects you. Do not stand or sit up quickly, especially if you are an older patient. This reduces the risk of dizzy or fainting spells. This medicine can cause you to bleed more easily. Try to avoid damage to your teeth and gums when you brush or floss your teeth. What side effects may I notice from receiving this medicine? Side effects that you should report to your doctor or health care professional as soon as possible: -black or bloody stools, blood in the urine or vomit -blurred vision -chest pain -difficulty breathing or wheezing -nausea or vomiting -severe stomach pain -skin rash, skin redness, blistering or peeling skin, hives, or itching -slurred speech or weakness on one side of the body -swelling of eyelids, throat, lips -unexplained weight gain or swelling -unusually weak or tired -yellowing of eyes or skin Side effects that usually do not require medical attention (report to your doctor or health care professional if they continue or are bothersome): -constipation -headache -heartburn This list may not describe all possible side effects. Call your doctor for medical advice about side effects. You may report side effects to FDA at 1-800-FDA-1088. Where should I keep my medicine? Keep out of the reach of children. Store at room  temperature between 15 and 30 degrees C (59 and 86 degrees F). Keep container tightly closed. Throw away any unused medicine after the expiration date. NOTE: This sheet is a summary. It may not cover all possible information. If you have questions about this medicine, talk to your doctor, pharmacist, or health care  provider.  2015, Elsevier/Gold Standard. (2009-12-17 20:10:16)  Orphenadrine tablets What is this medicine? ORPHENADRINE (or FEN a dreen) helps to relieve pain and stiffness in muscles and can treat muscle spasms. This medicine may be used for other purposes; ask your health care provider or pharmacist if you have questions. COMMON BRAND NAME(S): Norflex What should I tell my health care provider before I take this medicine? They need to know if you have any of these conditions: -glaucoma -heart disease -kidney disease -myasthenia gravis -peptic ulcer disease -prostate disease -stomach problems -an unusual or allergic reaction to orphenadrine, other medicines, foods, lactose, dyes, or preservatives -pregnant or trying to get pregnant -breast-feeding How should I use this medicine? Take this medicine by mouth with a full glass of water. Follow the directions on the prescription label. Take your medicine at regular intervals. Do not take your medicine more often than directed. Do not take more than you are told to take. Talk to your pediatrician regarding the use of this medicine in children. Special care may be needed. Patients over 58 years old may have a stronger reaction and need a smaller dose. Overdosage: If you think you have taken too much of this medicine contact a poison control center or emergency room at once. NOTE: This medicine is only for you. Do not share this medicine with others. What if I miss a dose? If you miss a dose, take it as soon as you can. If it is almost time for your next dose, take only that dose. Do not take double or extra doses. What may  interact with this medicine? -alcohol -antihistamines -barbiturates, like phenobarbital -benzodiazepines -cyclobenzaprine -medicines for pain -phenothiazines like chlorpromazine, mesoridazine, prochlorperazine, thioridazine This list may not describe all possible interactions. Give your health care provider a list of all the medicines, herbs, non-prescription drugs, or dietary supplements you use. Also tell them if you smoke, drink alcohol, or use illegal drugs. Some items may interact with your medicine. What should I watch for while using this medicine? Your mouth may get dry. Chewing sugarless gum or sucking hard candy, and drinking plenty of water may help. Contact your doctor if the problem does not go away or is severe. This medicine may cause dry eyes and blurred vision. If you wear contact lenses you may feel some discomfort. Lubricating drops may help. See your eye doctor if the problem does not go away or is severe. You may get drowsy or dizzy. Do not drive, use machinery, or do anything that needs mental alertness until you know how this medicine affects you. Do not stand or sit up quickly, especially if you are an older patient. This reduces the risk of dizzy or fainting spells. Alcohol may interfere with the effect of this medicine. Avoid alcoholic drinks. What side effects may I notice from receiving this medicine? Side effects that you should report to your doctor or health care professional as soon as possible: -allergic reactions like skin rash, itching or hives, swelling of the face, lips, or tongue -changes in vision -difficulty breathing -fast heartbeat or palpitations -hallucinations -light headedness, fainting spells -vomiting Side effects that usually do not require medical attention (report to your doctor or health care professional if they continue or are bothersome): -dizziness -drowsiness -headache -nausea This list may not describe all possible side effects.  Call your doctor for medical advice about side effects. You may report side effects to FDA at 1-800-FDA-1088. Where should I keep my medicine? Keep out of the reach  of children. Store at room temperature between 15 and 30 degrees C (59 and 86 degrees F). Protect from light. Keep container tightly closed. Throw away any unused medicine after the expiration date. NOTE: This sheet is a summary. It may not cover all possible information. If you have questions about this medicine, talk to your doctor, pharmacist, or health care provider.  2015, Elsevier/Gold Standard. (2008-07-11 17:19:12)

## 2014-08-31 NOTE — ED Provider Notes (Signed)
CSN: [id number]     Arrival date & time 08/31/14  1723 History   First MD Initiated Contact with Patient 08/31/14 1810     This chart was scribed for Delora Fuel, MD by Forrestine Him, ED Scribe. This patient was seen in room MH09/MH09 and the patient's care was started 6:18 PM.   Chief Complaint  Patient presents with  . Back Pain  . Shoulder Pain   HPI  HPI Comments: Kelli Khan is a 48 y.o. female with a PMHx of HTN and anemia who presents to the Emergency Department complaining of constant, moderate L shoulder pain that radiates to mid upper arm x 2 days. Pt states pain has progressively worsened this morning. She describes pain as deep and sharp currently rating it 9/10. Kelli Khan states she noted discomfort after helping her sister move. However, she denies any heavy lifting and bending over. She also mentions L sided constant, moderate lower back pain and L knee pain onset today. Discomfort is worsened with certain movements. She has tried OTC Tylenol with mild improvement for symptoms. She denies any fever or chills. Pt with known allergies to codeine and percocet. No other concerns this visit.  Past Medical History  Diagnosis Date  . Hypertension   . Anemia    Past Surgical History  Procedure Laterality Date  . Myomectomy     Family History  Problem Relation Age of Onset  . Heart attack Mother     age "78-40's"  . Heart attack Sister     age "41-50's:   History  Substance Use Topics  . Smoking status: Never Smoker   . Smokeless tobacco: Not on file  . Alcohol Use: No   OB History   Grav Para Term Preterm Abortions TAB SAB Ect Mult Living                 Review of Systems  Constitutional: Negative for fever and chills.  Musculoskeletal: Positive for arthralgias and back pain. Negative for joint swelling.  All other systems reviewed and are negative.     Allergies  Codeine and Percocet  Home Medications   Prior to Admission medications   Medication Sig Start Date  End Date Taking? Authorizing Provider  hydrochlorothiazide (MICROZIDE) 12.5 MG capsule Take 12.5 mg by mouth daily.    Historical Provider, MD  ibuprofen (ADVIL,MOTRIN) 600 MG tablet Take 1 tablet (600 mg total) by mouth every 6 (six) hours as needed. 05/31/14   Malvin Johns, MD  IRON CR PO Take 1 tablet by mouth daily.    Historical Provider, MD  ondansetron (ZOFRAN ODT) 4 MG disintegrating tablet 4mg  ODT q4 hours prn nausea/vomit 05/31/14   Malvin Johns, MD  oxyCODONE-acetaminophen (PERCOCET) 5-325 MG per tablet Take 2 tablets by mouth every 4 (four) hours as needed. 05/31/14   Malvin Johns, MD   Triage Vitals: BP 155/85  Pulse 71  Temp(Src) 98.2 F (36.8 C) (Oral)  Resp 18  SpO2 100%   Physical Exam  Nursing note and vitals reviewed. Constitutional: She is oriented to person, place, and time. She appears well-developed and well-nourished.  HENT:  Head: Normocephalic and atraumatic.  Eyes: EOM are normal. Pupils are equal, round, and reactive to light.  Neck: Normal range of motion. Neck supple. No JVD present.  Cardiovascular: Normal rate, regular rhythm and normal heart sounds.   No murmur heard. Pulmonary/Chest: Effort normal and breath sounds normal. She has no wheezes. She has no rales.  Abdominal: She exhibits no distension and  no mass. There is no tenderness.  Musculoskeletal: Normal range of motion. She exhibits tenderness. She exhibits no edema.  L SHOULDER: L shoulder is tender diffusely with rotatotor cuff impingement signs present Full passive ROM present Distally neurovascularly intact  BACK: Moderately tender over R paralumbar muscles with R sided spasm Postive striaght leg rasie bilaterally at 45 degrees  L KNEE:  Mildly tender to medial aspect of knee. No instability  No effusion Lachman's and Mcmurray's signs negative  Lymphadenopathy:    She has no cervical adenopathy.  Neurological: She is alert and oriented to person, place, and time. She has normal  reflexes. No cranial nerve deficit. Coordination normal.  Skin: Skin is warm and dry. No rash noted.  Psychiatric: She has a normal mood and affect. Her behavior is normal. Thought content normal.    ED Course  Procedures (including critical care time)  DIAGNOSTIC STUDIES: Oxygen Saturation is 100% on room air, normal by my interpretation.    COORDINATION OF CARE: 6:12 PM-Discussed treatment plan with pt at bedside and pt agreed to plan.     Labs Review Results for orders placed during the hospital encounter of 08/31/14  URINALYSIS, ROUTINE W REFLEX MICROSCOPIC      Result Value Ref Range   Color, Urine YELLOW  YELLOW   APPearance CLEAR  CLEAR   Specific Gravity, Urine 1.023  1.005 - 1.030   pH 5.5  5.0 - 8.0   Glucose, UA NEGATIVE  NEGATIVE mg/dL   Hgb urine dipstick NEGATIVE  NEGATIVE   Bilirubin Urine NEGATIVE  NEGATIVE   Ketones, ur NEGATIVE  NEGATIVE mg/dL   Protein, ur NEGATIVE  NEGATIVE mg/dL   Urobilinogen, UA 1.0  0.0 - 1.0 mg/dL   Nitrite NEGATIVE  NEGATIVE   Leukocytes, UA NEGATIVE  NEGATIVE   MDM   Final diagnoses:  Pain in left shoulder  Pain in left knee  Right-sided low back pain without sciatica    Pain in left shoulder, left knee, and lower back. All pain appears to be musculoskeletal in origin. Patient had recent activity of helping her sister pack removed. Pain is most likely related to repetitive motion involved in the process of packing. Patient is advised of these findings. She is discharged with prescriptions for naproxen and orphenadrine and is to follow up with her PCP.  I personally performed the services described in this documentation, which was scribed in my presence. The recorded information has been reviewed and is accurate.    Delora Fuel, MD 28/76/81 1572

## 2014-11-17 ENCOUNTER — Emergency Department (HOSPITAL_BASED_OUTPATIENT_CLINIC_OR_DEPARTMENT_OTHER)
Admission: EM | Admit: 2014-11-17 | Discharge: 2014-11-17 | Disposition: A | Discharge status: Home / Self Care | Payer: BC Managed Care – PPO | Attending: Emergency Medicine | Admitting: Emergency Medicine

## 2014-11-17 ENCOUNTER — Emergency Department (HOSPITAL_BASED_OUTPATIENT_CLINIC_OR_DEPARTMENT_OTHER): Payer: BC Managed Care – PPO

## 2014-11-17 DIAGNOSIS — S39012A Strain of muscle, fascia and tendon of lower back, initial encounter: Secondary | ICD-10-CM | POA: Insufficient documentation

## 2014-11-17 DIAGNOSIS — X58XXXA Exposure to other specified factors, initial encounter: Secondary | ICD-10-CM | POA: Insufficient documentation

## 2014-11-17 DIAGNOSIS — Z79899 Other long term (current) drug therapy: Secondary | ICD-10-CM | POA: Insufficient documentation

## 2014-11-17 DIAGNOSIS — Y998 Other external cause status: Secondary | ICD-10-CM | POA: Insufficient documentation

## 2014-11-17 DIAGNOSIS — S46912A Strain of unspecified muscle, fascia and tendon at shoulder and upper arm level, left arm, initial encounter: Secondary | ICD-10-CM | POA: Diagnosis not present

## 2014-11-17 DIAGNOSIS — I10 Essential (primary) hypertension: Secondary | ICD-10-CM | POA: Insufficient documentation

## 2014-11-17 DIAGNOSIS — D649 Anemia, unspecified: Secondary | ICD-10-CM | POA: Diagnosis not present

## 2014-11-17 DIAGNOSIS — T148XXA Other injury of unspecified body region, initial encounter: Secondary | ICD-10-CM

## 2014-11-17 DIAGNOSIS — E669 Obesity, unspecified: Secondary | ICD-10-CM | POA: Diagnosis not present

## 2014-11-17 DIAGNOSIS — Z79891 Long term (current) use of opiate analgesic: Secondary | ICD-10-CM | POA: Insufficient documentation

## 2014-11-17 DIAGNOSIS — M545 Low back pain, unspecified: Secondary | ICD-10-CM

## 2014-11-17 DIAGNOSIS — Z791 Long term (current) use of non-steroidal anti-inflammatories (NSAID): Secondary | ICD-10-CM | POA: Insufficient documentation

## 2014-11-17 DIAGNOSIS — Y9289 Other specified places as the place of occurrence of the external cause: Secondary | ICD-10-CM | POA: Insufficient documentation

## 2014-11-17 DIAGNOSIS — Y9389 Activity, other specified: Secondary | ICD-10-CM | POA: Insufficient documentation

## 2014-11-17 DIAGNOSIS — Z3202 Encounter for pregnancy test, result negative: Secondary | ICD-10-CM | POA: Diagnosis not present

## 2014-11-17 DIAGNOSIS — S3992XA Unspecified injury of lower back, initial encounter: Secondary | ICD-10-CM | POA: Diagnosis present

## 2014-11-17 LAB — URINALYSIS, ROUTINE W REFLEX MICROSCOPIC
Glucose, UA: NEGATIVE mg/dL
HGB URINE DIPSTICK: NEGATIVE
Ketones, ur: NEGATIVE mg/dL
Nitrite: NEGATIVE
PROTEIN: NEGATIVE mg/dL
Specific Gravity, Urine: 1.022 (ref 1.005–1.030)
UROBILINOGEN UA: 1 mg/dL (ref 0.0–1.0)
pH: 5.5 (ref 5.0–8.0)

## 2014-11-17 LAB — PREGNANCY, URINE: Preg Test, Ur: NEGATIVE

## 2014-11-17 LAB — URINE MICROSCOPIC-ADD ON

## 2014-11-17 MED ORDER — CYCLOBENZAPRINE HCL 10 MG PO TABS
10.0000 mg | ORAL_TABLET | Freq: Once | ORAL | Status: AC
  Administered 2014-11-17: 10 mg via ORAL
  Filled 2014-11-17: qty 1

## 2014-11-17 MED ORDER — CYCLOBENZAPRINE HCL 10 MG PO TABS: 10.0000 mg | ORAL_TABLET | Freq: Two times a day (BID) | ORAL | Status: DC | PRN

## 2014-11-17 MED ORDER — ACETAMINOPHEN 500 MG PO TABS: 500.0000 mg | ORAL_TABLET | Freq: Four times a day (QID) | ORAL | Status: DC | PRN

## 2014-11-17 NOTE — ED Provider Notes (Signed)
CSN: [id number]     Arrival date & time 11/17/14  1249 History   First MD Initiated Contact with Patient 11/17/14 1439     Chief Complaint  Patient presents with  . Muscle Pain     (Consider location/radiation/quality/duration/timing/severity/associated sxs/prior Treatment) HPI   48 year old female with history of hypertension and anemia presents complaining of shoulder and back pain. Patient states yesterday she was reaching for a chair when it nearly topple and in the process she developed pain to her left shoulder and her right lower back. She denies falling to the ground but states the pain has been persistent, 9 out of 10, nonradiating. She took Tylenol last night and this morning with some relief. She denies having any fever, chills, abdominal pain, bowel bladder incontinence, or new numbness or weakness. She felt a knot to her low back and that concerns her. She has no other complaint.  Past Medical History  Diagnosis Date  . Hypertension   . Anemia    Past Surgical History  Procedure Laterality Date  . Myomectomy     Family History  Problem Relation Age of Onset  . Heart attack Mother     age "25-40's"  . Heart attack Sister     age "55-50's:   History  Substance Use Topics  . Smoking status: Never Smoker   . Smokeless tobacco: Not on file  . Alcohol Use: No   OB History    No data available     Review of Systems  Constitutional: Negative for fever.  Genitourinary: Negative for dysuria.  Musculoskeletal: Positive for back pain.  Skin: Negative for rash and wound.      Allergies  Codeine and Percocet  Home Medications   Prior to Admission medications   Medication Sig Start Date End Date Taking? Authorizing Provider  hydrochlorothiazide (MICROZIDE) 12.5 MG capsule Take 12.5 mg by mouth daily.    Historical Provider, MD  ibuprofen (ADVIL,MOTRIN) 600 MG tablet Take 1 tablet (600 mg total) by mouth every 6 (six) hours as needed. 05/31/14   Malvin Johns, MD   IRON CR PO Take 1 tablet by mouth daily.    Historical Provider, MD  naproxen (NAPROSYN) 500 MG tablet Take 1 tablet (500 mg total) by mouth 2 (two) times daily. 07/29/81   Delora Fuel, MD  ondansetron (ZOFRAN ODT) 4 MG disintegrating tablet 4mg  ODT q4 hours prn nausea/vomit 05/31/14   Malvin Johns, MD  orphenadrine (NORFLEX) 100 MG tablet Take 1 tablet (100 mg total) by mouth 2 (two) times daily. 09/06/36   Delora Fuel, MD  oxyCODONE-acetaminophen (PERCOCET) 5-325 MG per tablet Take 2 tablets by mouth every 4 (four) hours as needed. 05/31/14   Malvin Johns, MD   BP 136/91 mmHg  Pulse 80  Temp(Src) 98.1 F (36.7 C) (Oral)  Resp 18  Ht 5\' 7"  (1.702 m)  Wt 192 lb (87.091 kg)  BMI 30.06 kg/m2  SpO2 100%  LMP 10/17/2014 Physical Exam  Constitutional: She appears well-developed and well-nourished. No distress.  Moderately obese African American female appears to be in no acute distress.  HENT:  Head: Atraumatic.  Eyes: Conjunctivae are normal.  Neck: Neck supple.  Neck with full range of motion.  Cardiovascular: Normal rate, regular rhythm and intact distal pulses.   Pulmonary/Chest: Effort normal and breath sounds normal.  Abdominal: Soft. There is no tenderness.  Musculoskeletal: She exhibits tenderness (low back: tenderness to lumbar midline and R paralumbar on palpation, no crepitus or step off. FROM.  L  shoulder: generalized mild tenderness throughout with FROM, normal strength, intact distal pulse and normal grip strength.  no deformity).  Neurological: She is alert.  Skin: No rash noted.  Psychiatric: She has a normal mood and affect.  Nursing note and vitals reviewed.   ED Course  Procedures (including critical care time)  3:08 PM Patient here with left shoulder pain and right low back pain after pulling on a chair to prevent it from falling. Her pain is likely musculoskeletal strain I have low suspicion for acute fracture or dislocation however patient requesting for x-ray of  her low back "just me she will" we'll provide muscle relaxant and will obtain x-ray as requested.  Labs Review Labs Reviewed  URINALYSIS, ROUTINE W REFLEX MICROSCOPIC    Imaging Review No results found.   EKG Interpretation None      MDM   Final diagnoses:  Low back pain  Muscle strain    BP 136/91 mmHg  Pulse 80  Temp(Src) 98.1 F (36.7 C) (Oral)  Resp 18  Ht 5\' 7"  (1.702 m)  Wt 192 lb (87.091 kg)  BMI 30.06 kg/m2  SpO2 100%  LMP 10/17/2014     Domenic Moras, PA-C 11/17/14 [place], MD 11/22/14 [phone]

## 2014-11-17 NOTE — ED Notes (Signed)
Pt up to desk saying she has to leave, PA notified of the same

## 2014-11-17 NOTE — Discharge Instructions (Signed)

## 2014-11-17 NOTE — ED Notes (Signed)
Pt. Reports L shoulder pain and L back pain started yesterday after she has been moving furniture in her house and trying to down size in her home.  Pt. Reports she feels a knot in the center of her back after moving furniture yesterday.

## 2015-05-16 ENCOUNTER — Emergency Department (INDEPENDENT_AMBULATORY_CARE_PROVIDER_SITE_OTHER)
Admission: EM | Admit: 2015-05-16 | Discharge: 2015-05-16 | Disposition: A | Discharge status: Home / Self Care | Payer: Self-pay | Source: Home / Self Care | Attending: Family Medicine | Admitting: Family Medicine

## 2015-05-16 ENCOUNTER — Emergency Department (INDEPENDENT_AMBULATORY_CARE_PROVIDER_SITE_OTHER): Payer: Self-pay

## 2015-05-16 ENCOUNTER — Encounter: Payer: Self-pay | Admitting: *Deleted

## 2015-05-16 DIAGNOSIS — M542 Cervicalgia: Secondary | ICD-10-CM

## 2015-05-16 DIAGNOSIS — M25512 Pain in left shoulder: Secondary | ICD-10-CM

## 2015-05-16 DIAGNOSIS — S29012A Strain of muscle and tendon of back wall of thorax, initial encounter: Secondary | ICD-10-CM

## 2015-05-16 DIAGNOSIS — M7582 Other shoulder lesions, left shoulder: Secondary | ICD-10-CM

## 2015-05-16 MED ORDER — CYCLOBENZAPRINE HCL 10 MG PO TABS: 10.0000 mg | ORAL_TABLET | Freq: Three times a day (TID) | ORAL | Status: DC

## 2015-05-16 MED ORDER — PREDNISONE 20 MG PO TABS: 20.0000 mg | ORAL_TABLET | Freq: Two times a day (BID) | ORAL | Status: DC

## 2015-05-16 NOTE — ED Notes (Signed)
Pt c/o LT shoulder and LBP x 4 days after doing yard work. She has taken Tylenol with no relief.

## 2015-05-16 NOTE — Discharge Instructions (Signed)
Apply ice pack for 20 to 30 minutes, 3 to 4 times daily  Continue until pain decreases.  Begin stretching and range of motion exercises as tolerated.

## 2015-05-16 NOTE — ED Provider Notes (Signed)
CSN: [id number]     Arrival date & time 05/16/15  1619 History   First MD Initiated Contact with Patient 05/16/15 1641     Chief Complaint  Patient presents with  . Shoulder Pain  . Back Pain      HPI Comments: Patient reports that she was moving furniture four days ago at a yard sale.  She recalls no injury, but the next day she had soreness in her left shoulder and the center of her upper back.  The pain has gradually increased and became much worse last night.  She has difficulty moving her left shoulder.  Patient is a 49 y.o. female presenting with shoulder pain. The history is provided by the patient.  Shoulder Pain Location:  Shoulder Time since incident:  4 days Injury: no   Shoulder location:  L shoulder Pain details:    Quality:  Aching   Radiates to: upper mid back.   Severity:  Severe   Onset quality:  Gradual   Duration:  4 days   Timing:  Constant   Progression:  Worsening Chronicity:  New Prior injury to area:  No Relieved by:  Nothing Worsened by:  Movement Ineffective treatments:  Acetaminophen Associated symptoms: back pain, decreased range of motion, muscle weakness, neck pain and stiffness   Associated symptoms: no fatigue, no fever, no numbness, no swelling and no tingling     Past Medical History  Diagnosis Date  . Hypertension   . Anemia    Past Surgical History  Procedure Laterality Date  . Myomectomy     Family History  Problem Relation Age of Onset  . Heart attack Mother     age "79-40's"  . Hypertension Mother   . Heart attack Sister     age "49-50's:  Marland Kitchen Hypertension Father   . Heart failure Father   . Cancer Father     prostate   History  Substance Use Topics  . Smoking status: Never Smoker   . Smokeless tobacco: Not on file  . Alcohol Use: No   OB History    No data available     Review of Systems  Constitutional: Negative for fever and fatigue.  Musculoskeletal: Positive for back pain, stiffness and neck pain.  All other  systems reviewed and are negative.   Allergies  Codeine and Percocet  Home Medications   Prior to Admission medications   Medication Sig Start Date End Date Taking? Authorizing Provider  acetaminophen (TYLENOL) 500 MG tablet Take 1 tablet (500 mg total) by mouth every 6 (six) hours as needed. 11/17/14  Yes Domenic Moras, PA-C  hydrochlorothiazide (MICROZIDE) 12.5 MG capsule Take 12.5 mg by mouth daily.   Yes Historical Provider, MD  IRON CR PO Take 1 tablet by mouth daily.   Yes Historical Provider, MD  cyclobenzaprine (FLEXERIL) 10 MG tablet Take 1 tablet (10 mg total) by mouth 3 (three) times daily. 05/16/15   Kandra Nicolas, MD  predniSONE (DELTASONE) 20 MG tablet Take 1 tablet (20 mg total) by mouth 2 (two) times daily. Take with food. 05/16/15   Kandra Nicolas, MD   BP 157/99 mmHg  Pulse 69  Temp(Src) 98.3 F (36.8 C) (Oral)  Resp 18  Ht 5' 7.5" (1.715 m)  Wt 208 lb (94.348 kg)  BMI 32.08 kg/m2  SpO2 100%  LMP 10/17/2014 Physical Exam  Constitutional: She is oriented to person, place, and time. She appears well-developed and well-nourished. No distress.  Patient is obese (BMI 32.1)  HENT:  Head: Normocephalic.  Eyes: Conjunctivae are normal. Pupils are equal, round, and reactive to light.  Neck:  Decreased range of motion   Cardiovascular: Normal heart sounds.   Pulmonary/Chest: Breath sounds normal.  Musculoskeletal:       Left shoulder: She exhibits decreased range of motion, tenderness, bony tenderness, pain and decreased strength. She exhibits no swelling, no crepitus, no deformity and normal pulse.       Cervical back: She exhibits decreased range of motion, tenderness and pain.       Arms: Patient unable to actively abduct left shoulder to horizontal; passively can abduct only a few degrees above horizontal.  Positive empty can test.  Positive Apley's test (overhead movement).  Decreased internal and external rotational strength.  Decreased external rotational range of  motion.  Neck has tenderness to palpation over the sternocleidomastoid muscle and trapezius muscle on the right. There is distinct tenderness over medial and inferior edges of left scapula.  Pain elicited by resisted abduction of left shoulder while palpating left rhomboid muscles.   Lymphadenopathy:    She has no cervical adenopathy.  Neurological: She is alert and oriented to person, place, and time.  Skin: Skin is warm and dry. No rash noted.  Nursing note and vitals reviewed.   ED Course  Procedures  none  Imaging Review Dg Cervical Spine Complete  05/16/2015   CLINICAL DATA:  Acute onset of left shoulder and neck pain. Initial encounter.  EXAM: CERVICAL SPINE  4+ VIEWS  COMPARISON:  None.  FINDINGS: There is no evidence of fracture or subluxation. Vertebral bodies demonstrate normal height and alignment. Intervertebral disc spaces are preserved. Prevertebral soft tissues are within normal limits. The provided odontoid view demonstrates no significant abnormality.  The visualized lung apices are clear.  IMPRESSION: No evidence of fracture or subluxation along the cervical spine. If the patient's symptoms persist, MRI could be considered for further evaluation.   Electronically Signed   By: Garald Balding M.D.   On: 05/16/2015 18:23   Dg Shoulder Left  05/16/2015   CLINICAL DATA:  LEFT neck and shoulder pain for 4 days  EXAM: LEFT SHOULDER - 2+ VIEW  COMPARISON:  01/15/2015  FINDINGS: AC joint alignment normal.  Osseous mineralization normal.  No acute fracture, dislocation or bone destruction.  Visualized LEFT ribs intact.  IMPRESSION: Normal exam.   Electronically Signed   By: Lavonia Dana M.D.   On: 05/16/2015 18:24     MDM   1. Rhomboid muscle strain, initial encounter   2. Rotator cuff tendonitis, left    Patient does not tolerate NSAID's; begin prednisone burst.  Rx for Flexeril 10mg  TID. Apply ice pack for 20 to 30 minutes, 3 to 4 times daily  Continue until pain decreases.   Begin stretching and range of motion exercises as tolerated. Followup with Dr. Aundria Mems ([facility]) if not improving about two weeks.     Kandra Nicolas, MD 05/20/15 [phone]

## 2015-12-31 ENCOUNTER — Emergency Department (HOSPITAL_BASED_OUTPATIENT_CLINIC_OR_DEPARTMENT_OTHER)
Admission: EM | Admit: 2015-12-31 | Discharge: 2015-12-31 | Disposition: A | Discharge status: Home / Self Care | Payer: BLUE CROSS/BLUE SHIELD | Attending: Emergency Medicine | Admitting: Emergency Medicine

## 2015-12-31 ENCOUNTER — Encounter (HOSPITAL_BASED_OUTPATIENT_CLINIC_OR_DEPARTMENT_OTHER): Payer: Self-pay | Admitting: *Deleted

## 2015-12-31 ENCOUNTER — Emergency Department (HOSPITAL_BASED_OUTPATIENT_CLINIC_OR_DEPARTMENT_OTHER): Payer: BLUE CROSS/BLUE SHIELD

## 2015-12-31 DIAGNOSIS — Y998 Other external cause status: Secondary | ICD-10-CM | POA: Diagnosis not present

## 2015-12-31 DIAGNOSIS — Z862 Personal history of diseases of the blood and blood-forming organs and certain disorders involving the immune mechanism: Secondary | ICD-10-CM | POA: Insufficient documentation

## 2015-12-31 DIAGNOSIS — X501XXA Overexertion from prolonged static or awkward postures, initial encounter: Secondary | ICD-10-CM | POA: Diagnosis not present

## 2015-12-31 DIAGNOSIS — M25531 Pain in right wrist: Secondary | ICD-10-CM

## 2015-12-31 DIAGNOSIS — Z79899 Other long term (current) drug therapy: Secondary | ICD-10-CM | POA: Diagnosis not present

## 2015-12-31 DIAGNOSIS — Y9289 Other specified places as the place of occurrence of the external cause: Secondary | ICD-10-CM | POA: Diagnosis not present

## 2015-12-31 DIAGNOSIS — Y9389 Activity, other specified: Secondary | ICD-10-CM | POA: Insufficient documentation

## 2015-12-31 DIAGNOSIS — R200 Anesthesia of skin: Secondary | ICD-10-CM | POA: Insufficient documentation

## 2015-12-31 DIAGNOSIS — Z7952 Long term (current) use of systemic steroids: Secondary | ICD-10-CM | POA: Diagnosis not present

## 2015-12-31 DIAGNOSIS — I10 Essential (primary) hypertension: Secondary | ICD-10-CM | POA: Diagnosis not present

## 2015-12-31 DIAGNOSIS — S6991XA Unspecified injury of right wrist, hand and finger(s), initial encounter: Secondary | ICD-10-CM | POA: Diagnosis not present

## 2015-12-31 MED ORDER — ACETAMINOPHEN 325 MG PO TABS
650.0000 mg | ORAL_TABLET | Freq: Four times a day (QID) | ORAL | Status: DC | PRN
  Administered 2015-12-31: 650 mg via ORAL
  Filled 2015-12-31: qty 2

## 2015-12-31 MED ORDER — NAPROXEN 250 MG PO TABS
500.0000 mg | ORAL_TABLET | Freq: Once | ORAL | Status: AC
  Administered 2015-12-31: 500 mg via ORAL
  Filled 2015-12-31: qty 2

## 2015-12-31 MED ORDER — ACETAMINOPHEN 500 MG PO TABS: 500.0000 mg | ORAL_TABLET | Freq: Four times a day (QID) | ORAL | Status: DC | PRN

## 2015-12-31 MED ORDER — NAPROXEN 500 MG PO TABS: 500.0000 mg | ORAL_TABLET | Freq: Two times a day (BID) | ORAL | Status: DC

## 2015-12-31 MED FILL — NAPROXEN 500 MG TABLET: 500 | 15 days supply | Qty: 30 | Fill #0

## 2015-12-31 NOTE — ED Provider Notes (Signed)
CSN: [id number]     Arrival date & time 12/31/15  1449 History   First MD Initiated Contact with Patient 12/31/15 1633     Chief Complaint  Patient presents with  . Wrist Pain     (Consider location/radiation/quality/duration/timing/severity/associated sxs/prior Treatment) Patient is a 50 y.o. female presenting with wrist pain. The history is provided by the patient.  Wrist Pain This is a new problem. The current episode started in the past 7 days. The problem occurs constantly. The problem has been gradually worsening. Associated symptoms include arthralgias, joint swelling (mild right wrist) and numbness (intermittent). Pertinent negatives include no fever or weakness. The symptoms are aggravated by bending. She has tried acetaminophen and relaxation for the symptoms. The treatment provided mild relief.   Patient reports she was helping a friend move furniture Saturday night when the furniture began to fall towards her.  She reached out and caught it, but felt like her wrist hyperextended.  She denies blunt trauma.  She has tried tylenol, flexeril, and muscle rubs with minimal relief.  Denies weakness, color change, or loss of function.  Past Medical History  Diagnosis Date  . Hypertension   . Anemia    Past Surgical History  Procedure Laterality Date  . Myomectomy     Family History  Problem Relation Age of Onset  . Heart attack Mother     age "60-40's"  . Hypertension Mother   . Heart attack Sister     age "57-50's:  Marland Kitchen Hypertension Father   . Heart failure Father   . Cancer Father     prostate   Social History  Substance Use Topics  . Smoking status: Never Smoker   . Smokeless tobacco: Never Used  . Alcohol Use: No   OB History    No data available     Review of Systems  Constitutional: Negative for fever.  Musculoskeletal: Positive for joint swelling (mild right wrist) and arthralgias.  Skin: Negative for color change and wound.  Neurological: Positive for  numbness (intermittent). Negative for weakness.  All other systems reviewed and are negative.     Allergies  Codeine and Percocet  Home Medications   Prior to Admission medications   Medication Sig Start Date End Date Taking? Authorizing Provider  losartan (COZAAR) 50 MG tablet Take 50 mg by mouth daily.   Yes Historical Provider, MD  acetaminophen (TYLENOL) 500 MG tablet Take 1 tablet (500 mg total) by mouth every 6 (six) hours as needed. 11/17/14   Domenic Moras, PA-C  cyclobenzaprine (FLEXERIL) 10 MG tablet Take 1 tablet (10 mg total) by mouth 3 (three) times daily. 05/16/15   Kandra Nicolas, MD  hydrochlorothiazide (MICROZIDE) 12.5 MG capsule Take 12.5 mg by mouth daily.    Historical Provider, MD  IRON CR PO Take 1 tablet by mouth daily.    Historical Provider, MD  predniSONE (DELTASONE) 20 MG tablet Take 1 tablet (20 mg total) by mouth 2 (two) times daily. Take with food. 05/16/15   Kandra Nicolas, MD   BP 125/87 mmHg  Pulse 64  Temp(Src) 98.5 F (36.9 C) (Oral)  Resp 16  Ht 5\' 7"  (1.702 m)  Wt 86.183 kg  BMI 29.75 kg/m2  SpO2 100%  LMP 10/17/2014 Physical Exam  Constitutional: She is oriented to person, place, and time. She appears well-developed and well-nourished.  HENT:  Head: Atraumatic.  Eyes: Conjunctivae are normal. No scleral icterus.  Neck: No tracheal deviation present.  Cardiovascular:  Pulses:  Dorsalis pedis pulses are 2+ on the right side, and 2+ on the left side.  Capillary refill less than 3 seconds in all 5 digits of right hand.  Pulmonary/Chest: Effort normal. No respiratory distress.  Musculoskeletal: She exhibits tenderness.       Right wrist: She exhibits decreased range of motion (secondary to pain), tenderness, bony tenderness and swelling (mild). She exhibits no effusion, no crepitus, no deformity and no laceration.       Left wrist: Normal.       Right hand: She exhibits normal range of motion, no tenderness and no bony tenderness. Normal  sensation noted. Normal strength noted. She exhibits no wrist extension trouble.  Anatomical snuffbox tenderness over right wrist.  Neurological: She is alert and oriented to person, place, and time.  Strength and sensation intact bilaterally throughout upper extremities and distal to injury. 5/5 wrist strength b/l.  5/5 grip strength bilaterally.  Skin: Skin is warm and dry. No abrasion, no bruising, no ecchymosis and no laceration noted.  Psychiatric: She has a normal mood and affect. Her behavior is normal.    ED Course  Procedures (including critical care time) Labs Review Labs Reviewed - No data to display  Imaging Review Dg Wrist Complete Right  12/31/2015  CLINICAL DATA:  Initial encounter for Pt dropped a piece of furniture on her rt wrist and is having pain and swelling a EXAM: RIGHT WRIST - COMPLETE 3+ VIEW COMPARISON:  None. FINDINGS: Mild joint space narrowing about the radiocarpal joint and radial aspect of the carpal bones. No acute fracture or dislocation. Scaphoid intact. IMPRESSION: No acute osseous abnormality. Electronically Signed   By: Abigail Miyamoto M.D.   On: 12/31/2015 15:53   I have personally reviewed and evaluated these images and lab results as part of my medical decision-making.   EKG Interpretation None      MDM   Final diagnoses:  Right wrist pain    Patient presents with wrist pain after hyperextending her wrist Saturday evening when she caught a piece of furniture as it was falling towards her.  No loss of function or weakness.  Pain with movement. VSS, NAD.  On exam, decreased ROM secondary to pain of right wrist.  NVI distal to injury.  Plain films negative for fracture.  She does have anatomical snuffbox tenderness.  Low suspicion for scaphoid fx.  However, will place in thumb spica and recommend repeat xrays in 1 week.  Will give naprosyn and tylenol for pain.  She has follow up with PCP in 3 days. Evaluation does not show pathology requiring ongoing  emergent intervention or admission. Pt is hemodynamically stable and mentating appropriately. Discussed findings/results and plan with patient/guardian, who agrees with plan. All questions answered. Return precautions discussed and outpatient follow up given.      Gloriann Loan, PA-C 12/31/15 1726  Pattricia Boss, MD 12/31/15 2252

## 2015-12-31 NOTE — ED Notes (Signed)
Pt reports a piece of furniture fell into her right wrist on Saturday evening states pain is getting worse

## 2015-12-31 NOTE — Discharge Instructions (Signed)
Wrist Pain There are many things that can cause wrist pain. Some common causes include:  An injury to the wrist area, such as a sprain, strain, or fracture.  Overuse of the joint.  A condition that causes increased pressure on a nerve in the wrist (carpal tunnel syndrome).  Wear and tear of the joints that occurs with aging (osteoarthritis).  A variety of other types of arthritis. Sometimes, the cause of wrist pain is not known. The pain often goes away when you follow your health care provider's instructions for relieving pain at home. If your wrist pain continues, tests may need to be done to diagnose your condition. HOME CARE INSTRUCTIONS Pay attention to any changes in your symptoms. Take these actions to help with your pain:  Rest the wrist area for at least 48 hours or as told by your health care provider.  If directed, apply ice to the injured area:  Put ice in a plastic bag.  Place a towel between your skin and the bag.  Leave the ice on for 20 minutes, 2-3 times per day.  Keep your arm raised (elevated) above the level of your heart while you are sitting or lying down.  If a splint or elastic bandage has been applied, use it as told by your health care provider.  Remove the splint or bandage only as told by your health care provider.  Loosen the splint or bandage if your fingers become numb or have a tingling feeling, or if they turn cold or blue.  Take over-the-counter and prescription medicines only as told by your health care provider.  Keep all follow-up visits as told by your health care provider. This is important. SEEK MEDICAL CARE IF:  Your pain is not helped by treatment.  Your pain gets worse. SEEK IMMEDIATE MEDICAL CARE IF:  Your fingers become swollen.  Your fingers turn white, very red, or cold and blue.  Your fingers are numb or have a tingling feeling.  You have difficulty moving your fingers.   This information is not intended to replace  advice given to you by your health care provider. Make sure you discuss any questions you have with your health care provider.   Document Released: 09/24/2005 Document Revised: 09/05/2015 Document Reviewed: 05/02/2015 Elsevier Interactive Patient Education 2016 [employer]  

## 2016-01-01 ENCOUNTER — Telehealth: Payer: Self-pay

## 2016-01-01 NOTE — Telephone Encounter (Signed)
Called numbers listed.  Was able to speak to Berton Bon, pt's sister, who gave me a current number for patient [phone]).  Left a message for call back.

## 2016-01-02 ENCOUNTER — Encounter: Payer: Self-pay | Admitting: Physician Assistant

## 2016-01-02 ENCOUNTER — Ambulatory Visit (INDEPENDENT_AMBULATORY_CARE_PROVIDER_SITE_OTHER): Payer: BLUE CROSS/BLUE SHIELD | Admitting: Physician Assistant

## 2016-01-02 VITALS — BP 120/74 | HR 61 | Temp 98.2°F | Ht 68.0 in | Wt 236.4 lb

## 2016-01-02 DIAGNOSIS — S6991XA Unspecified injury of right wrist, hand and finger(s), initial encounter: Secondary | ICD-10-CM | POA: Diagnosis not present

## 2016-01-02 DIAGNOSIS — R51 Headache: Secondary | ICD-10-CM | POA: Diagnosis not present

## 2016-01-02 DIAGNOSIS — R42 Dizziness and giddiness: Secondary | ICD-10-CM | POA: Diagnosis not present

## 2016-01-02 DIAGNOSIS — R519 Headache, unspecified: Secondary | ICD-10-CM

## 2016-01-02 DIAGNOSIS — S6991XD Unspecified injury of right wrist, hand and finger(s), subsequent encounter: Secondary | ICD-10-CM | POA: Diagnosis not present

## 2016-01-02 MED ORDER — METHYLPREDNISOLONE ACETATE 40 MG/ML IJ SUSP
40.0000 mg | Freq: Once | INTRAMUSCULAR | Status: AC
  Administered 2016-01-02: 40 mg via INTRAMUSCULAR

## 2016-01-02 NOTE — Progress Notes (Signed)
Pre visit review using our clinic review tool, if applicable. No additional management support is needed unless otherwise documented below in the visit note. 

## 2016-01-02 NOTE — Patient Instructions (Signed)
Please continue bracing and pain medication. You will be contacted by Orthopedic Surgery for assessment.  Please go to the lab. I will call you with your results. Continue BP medications as directed. You will be contacted by Neurology for further assessment of these chronic symptoms as they may be related to pressure or a migraine variant.

## 2016-01-03 LAB — CBC
HCT: 40.2 % (ref 36.0–46.0)
Hemoglobin: 13 g/dL (ref 12.0–15.0)
MCHC: 32.4 g/dL (ref 30.0–36.0)
MCV: 87.6 fl (ref 78.0–100.0)
Platelets: 305 10*3/uL (ref 150.0–400.0)
RBC: 4.59 Mil/uL (ref 3.87–5.11)
RDW: 14.6 % (ref 11.5–15.5)
WBC: 6.1 10*3/uL (ref 4.0–10.5)

## 2016-01-03 LAB — BASIC METABOLIC PANEL
BUN: 12 mg/dL (ref 6–23)
CO2: 30 mEq/L (ref 19–32)
Calcium: 9.8 mg/dL (ref 8.4–10.5)
Chloride: 100 mEq/L (ref 96–112)
Creatinine, Ser: 0.77 mg/dL (ref 0.40–1.20)
GFR: 101.49 mL/min (ref >60.00)
GLUCOSE: 82 mg/dL (ref 70–99)
POTASSIUM: 3.8 meq/L (ref 3.5–5.1)
SODIUM: 137 meq/L (ref 135–145)

## 2016-01-03 LAB — TSH: TSH: 0.75 u[IU]/mL (ref 0.35–4.50)

## 2016-01-04 ENCOUNTER — Encounter: Payer: Self-pay | Admitting: Family Medicine

## 2016-01-04 ENCOUNTER — Ambulatory Visit (INDEPENDENT_AMBULATORY_CARE_PROVIDER_SITE_OTHER): Payer: BLUE CROSS/BLUE SHIELD | Admitting: Family Medicine

## 2016-01-04 VITALS — BP 113/81 | HR 69 | Ht 68.0 in | Wt 230.0 lb

## 2016-01-04 DIAGNOSIS — S6991XA Unspecified injury of right wrist, hand and finger(s), initial encounter: Secondary | ICD-10-CM

## 2016-01-04 MED ORDER — HYDROCODONE-ACETAMINOPHEN 5-325 MG PO TABS: 1.0000 | ORAL_TABLET | Freq: Four times a day (QID) | ORAL | Status: DC | PRN

## 2016-01-04 MED FILL — HYDROCODON-APAP 5-325: 5-325 | 10 days supply | Qty: 40 | Fill #0

## 2016-01-04 NOTE — Patient Instructions (Signed)
I'm concerned you may have torn the ligament holding the wrist joint together based on your x-rays. We will go ahead with an MRI to further assess. Wear thumb spica regularly as discussed. Elevate above your heart when possible. Continue the naprosyn with norco as needed for severe pain (no driving on the norco). We will call you with results and next steps.

## 2016-01-04 NOTE — Progress Notes (Signed)
Patient presents to clinic today to establish care.  Patient was seen in the ER on Saturday after a piece of large furniture fell onto her R wirst causing pain and swelling. Imaging (X-ray) performed was negative for fracture but hairline fracture of scaphoid could not be excluded. Brace was applied and instructions for follow-up given to patient. Patient endorses continued pain over scaphoid without bruising or swelling. Denies numbness, tingling. Denies radiation of pain. Pain is described as a pulling sensation.  Patient also c/o a bizarre sensation of pressure behind eyes and frontal region over the past several months. Denis headache but notes occasional lightheadedness and vision changes. Has never had this assessed. Denies trauma or injury. Denies confusion. Denies change in hearing. Does check BP at home and states BP is normal with losartan and carvedilol. Denies hypotension or lightheadedness with standing.  Past Medical History  Diagnosis Date  . Hypertension   . Anemia     Current Outpatient Prescriptions on File Prior to Visit  Medication Sig Dispense Refill  . acetaminophen (TYLENOL) 500 MG tablet Take 1 tablet (500 mg total) by mouth every 6 (six) hours as needed. 30 tablet 0  . losartan (COZAAR) 50 MG tablet Take 50 mg by mouth daily.    . naproxen (NAPROSYN) 500 MG tablet Take 1 tablet (500 mg total) by mouth 2 (two) times daily. 30 tablet 0   No current facility-administered medications on file prior to visit.    Allergies  Allergen Reactions  . Codeine Itching  . Percocet [Oxycodone-Acetaminophen]     Family History  Problem Relation Age of Onset  . Heart attack Mother     age "58-40's"  . Hypertension Mother   . Heart attack Sister     age "9-50's:  Marland Kitchen Hypertension Father   . Heart failure Father   . Cancer Father     prostate    Social History   Social History  . Marital Status: Single    Spouse Name: N/A  . Number of Children: N/A  . Years of  Education: N/A   Social History Main Topics  . Smoking status: Never Smoker   . Smokeless tobacco: Never Used  . Alcohol Use: No  . Drug Use: No  . Sexual Activity: Not Asked   Other Topics Concern  . None   Social History Narrative   Works as a Environmental education officer, which requires some heavy lifting.   Review of Systems  Constitutional: Negative for fever and weight loss.  HENT: Negative for hearing loss.   Eyes: Positive for blurred vision. Negative for double vision, photophobia, discharge and redness.  Respiratory: Negative for shortness of breath.   Cardiovascular: Negative for chest pain and palpitations.  Musculoskeletal: Positive for joint pain. Negative for falls.  Neurological: Positive for dizziness. Negative for tingling, tremors, sensory change, speech change, focal weakness, seizures, loss of consciousness and headaches.  Psychiatric/Behavioral: Negative for depression.  All other systems reviewed and are negative.  BP 120/74 mmHg  Pulse 61  Temp(Src) 98.2 F (36.8 C) (Oral)  Ht 5\' 8"  (1.727 m)  Wt 236 lb 6.4 oz (107.23 kg)  BMI 35.95 kg/m2  SpO2 99%  LMP 10/17/2014  Physical Exam  Constitutional: She is oriented to person, place, and time and well-developed, well-nourished, and in no distress.  HENT:  Head: Normocephalic and atraumatic.  Right Ear: External ear normal.  Left Ear: External ear normal.  Eyes: Conjunctivae and EOM are normal. Pupils are equal, round, and reactive to  light.  Neck: Neck supple. No thyromegaly present.  Cardiovascular: Normal rate, regular rhythm, normal heart sounds and intact distal pulses.   Pulmonary/Chest: Effort normal and breath sounds normal. No respiratory distress. She has no wheezes. She has no rales. She exhibits no tenderness.  Musculoskeletal:       Right wrist: She exhibits tenderness. She exhibits normal range of motion, no bony tenderness and no swelling.  Pain with plantarflexion and dorsiflexion. Some pain  noted with palpation over snuff box. Extremity is neurovascularly intact.  Neurological: She is alert and oriented to person, place, and time. No cranial nerve deficit.  Skin: Skin is warm and dry. No rash noted.  Psychiatric: Affect normal.  Vitals reviewed.  Recent Results (from the past 2160 hour(s))  CBC     Status: None   Collection Time: 01/02/16  3:20 PM  Result Value Ref Range   WBC 6.1 4.0 - 10.5 K/uL   RBC 4.59 3.87 - 5.11 Mil/uL   Platelets 305.0 150.0 - 400.0 K/uL   Hemoglobin 13.0 12.0 - 15.0 g/dL   HCT 40.2 36.0 - 46.0 %   MCV 87.6 78.0 - 100.0 fl   MCHC 32.4 30.0 - 36.0 g/dL   RDW 14.6 11.5 - [id number] %  Basic Metabolic Panel (BMET)     Status: None   Collection Time: 01/02/16  3:20 PM  Result Value Ref Range   Sodium 137 135 - 145 mEq/L   Potassium 3.8 3.5 - 5.1 mEq/L   Chloride 100 96 - 112 mEq/L   CO2 30 19 - 32 mEq/L   Glucose, Bld 82 70 - 99 mg/dL   BUN 12 6 - 23 mg/dL   Creatinine, Ser 0.77 0.40 - 1.20 mg/dL   Calcium 9.8 8.4 - 10.5 mg/dL   GFR 101.49 >60.00 mL/min  TSH     Status: None   Collection Time: 01/02/16  3:20 PM  Result Value Ref Range   TSH 0.75 0.35 - 4.50 uIU/mL    Assessment/Plan: Pressure in head Will check CBC, TSH and BMP today. Concern for a pseudotumor like situation or potential migraine variant. No emergent findings. Referral to Neurology for further assessment placed.  Right wrist injury Will refer to Ortho as a hairline fracture cannot be ruled out with initial imaging and there is snuffbox tenderness present. Continue Bracing, pain medication. Apply topical Aspercreme to the area. Elevate on a pillow.

## 2016-01-06 NOTE — Assessment & Plan Note (Signed)
Will refer to Ortho as a hairline fracture cannot be ruled out with initial imaging and there is snuffbox tenderness present. Continue Bracing, pain medication. Apply topical Aspercreme to the area. Elevate on a pillow.

## 2016-01-06 NOTE — Assessment & Plan Note (Signed)
Will check CBC, TSH and BMP today. Concern for a pseudotumor like situation or potential migraine variant. No emergent findings. Referral to Neurology for further assessment placed.

## 2016-01-09 NOTE — Progress Notes (Signed)
PCP and consultation requested by: Leeanne Rio, PA-C  Subjective:   HPI: Patient is a 50 y.o. female here for right wrist injury.  Patient reports she was moving weight equipment on 12/31 when she accidentally hyperextended her right wrist all the way. Immediate pain - still with 8/10 sharp level of pain dorsal wrist. Had shot of depomedrol, radiographs 1/2 read as normal. Has been taking naprosyn and wearing thumb spica brace.. No prior injuries. Pain worse with any motions of the wrist. + swelling. No skin changes, fever.  Past Medical History  Diagnosis Date  . Hypertension   . Anemia     Current Outpatient Prescriptions on File Prior to Visit  Medication Sig Dispense Refill  . acetaminophen (TYLENOL) 500 MG tablet Take 1 tablet (500 mg total) by mouth every 6 (six) hours as needed. 30 tablet 0  . aspirin 81 MG tablet Take 81 mg by mouth daily.    . carvedilol (COREG) 12.5 MG tablet Take 1 tablet by mouth daily. Reported on 01/02/2016  5  . losartan (COZAAR) 50 MG tablet Take 50 mg by mouth daily.    . naproxen (NAPROSYN) 500 MG tablet Take 1 tablet (500 mg total) by mouth 2 (two) times daily. 30 tablet 0   No current facility-administered medications on file prior to visit.    Past Surgical History  Procedure Laterality Date  . Myomectomy      Allergies  Allergen Reactions  . Codeine Itching  . Percocet [Oxycodone-Acetaminophen]     Social History   Social History  . Marital Status: Single    Spouse Name: N/A  . Number of Children: N/A  . Years of Education: N/A   Occupational History  . Not on file.   Social History Main Topics  . Smoking status: Never Smoker   . Smokeless tobacco: Never Used  . Alcohol Use: No  . Drug Use: No  . Sexual Activity: Not on file   Other Topics Concern  . Not on file   Social History Narrative   Works as a Environmental education officer, which requires some heavy lifting.    Family History  Problem Relation Age of Onset   . Heart attack Mother     age "61-40's"  . Hypertension Mother   . Heart attack Sister     age "74-50's:  Marland Kitchen Hypertension Father   . Heart failure Father   . Cancer Father     prostate    BP 113/81 mmHg  Pulse 69  Ht 5\' 8"  (1.727 m)  Wt 230 lb (104.327 kg)  BMI 34.98 kg/m2  LMP 10/17/2014  Review of Systems: See HPI above.    Objective:  Physical Exam:  Gen: NAD  Right wrist: Mod swelling, no bruising dorsally.  No other deformity. TTP greatest distal radius - less over DRUJ.  No other tenderness. FROM digits. Mod limitation wrist flexion and extension, all motions painful. NVI distally.  Left wrist: FROM without pain.    Assessment & Plan:  1. Right wrist injury - independently reviewed radiographs and am concerned about the increased clear space of the DRUJ along with tenderness here.  She has an old radiograph of the left wrist and right DRUJ is definitely wider on radiographs from 12/31/15.  Advised patient to continue with thumb spica brace but we will go ahead with MRI to assess for complete tear of ligament here.  Continue naprosyn, norco as needed.

## 2016-01-09 NOTE — Assessment & Plan Note (Signed)
independently reviewed radiographs and am concerned about the increased clear space of the DRUJ along with tenderness here.  She has an old radiograph of the left wrist and right DRUJ is definitely wider on radiographs from 12/31/15.  Advised patient to continue with thumb spica brace but we will go ahead with MRI to assess for complete tear of ligament here.  Continue naprosyn, norco as needed.

## 2016-01-13 ENCOUNTER — Ambulatory Visit (HOSPITAL_BASED_OUTPATIENT_CLINIC_OR_DEPARTMENT_OTHER)
Admission: RE | Admit: 2016-01-13 | Discharge: 2016-01-13 | Disposition: A | Discharge status: Home / Self Care | Payer: BLUE CROSS/BLUE SHIELD | Source: Ambulatory Visit | Attending: Family Medicine | Admitting: Family Medicine

## 2016-01-13 DIAGNOSIS — M659 Synovitis and tenosynovitis, unspecified: Secondary | ICD-10-CM | POA: Insufficient documentation

## 2016-01-13 DIAGNOSIS — S6991XA Unspecified injury of right wrist, hand and finger(s), initial encounter: Secondary | ICD-10-CM | POA: Diagnosis not present

## 2016-01-13 DIAGNOSIS — X58XXXA Exposure to other specified factors, initial encounter: Secondary | ICD-10-CM | POA: Insufficient documentation

## 2016-01-14 ENCOUNTER — Encounter: Payer: BLUE CROSS/BLUE SHIELD | Admitting: Physician Assistant

## 2016-01-14 ENCOUNTER — Telehealth: Payer: Self-pay | Admitting: Physician Assistant

## 2016-01-15 ENCOUNTER — Encounter: Payer: Self-pay | Admitting: Family Medicine

## 2016-01-15 ENCOUNTER — Ambulatory Visit (INDEPENDENT_AMBULATORY_CARE_PROVIDER_SITE_OTHER): Payer: Self-pay | Admitting: Family Medicine

## 2016-01-15 VITALS — BP 118/80 | HR 67 | Ht 68.0 in | Wt 193.0 lb

## 2016-01-15 DIAGNOSIS — S6991XA Unspecified injury of right wrist, hand and finger(s), initial encounter: Secondary | ICD-10-CM

## 2016-01-15 MED ORDER — METHYLPREDNISOLONE ACETATE 40 MG/ML IJ SUSP
40.0000 mg | Freq: Once | INTRAMUSCULAR | Status: AC
  Administered 2016-01-15: 20 mg via INTRA_ARTICULAR

## 2016-01-16 NOTE — Assessment & Plan Note (Signed)
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  Emphasized wearing the thumb spica brace at all times - can be careful and remove to wash area only.  We reviewed the increase in space of DRUJ - luckily she does not have any associated injuries to warrant surgical intervention (scapholunate dissociation, TFCC tear) and she should do well with bracing (discussed short arm cast as well).  Plan to f/u in 4 weeks for reevaluation.  After informed written consent patient was seated in chair in exam room.  Area overlying 1st dorsal compartment prepped with alcohol swab then injected with 0.5:0.35mL marcaine: depomedrol.  Patient tolerated procedure well without immediate complications.

## 2016-01-16 NOTE — Progress Notes (Signed)
PCP and consultation requested by: Leeanne Rio, PA-C  Subjective:   HPI: Patient is a 50 y.o. female here for right wrist injury.  1/6: Patient reports she was moving weight equipment on 12/31 when she accidentally hyperextended her right wrist all the way. Immediate pain - still with 8/10 sharp level of pain dorsal wrist. Had shot of depomedrol, radiographs 1/2 read as normal. Has been taking naprosyn and wearing thumb spica brace.. No prior injuries. Pain worse with any motions of the wrist. + swelling. No skin changes, fever.  1/17: Patient returns today following MRI result to consider dequervains injection. She reports not wearing brace at all times. Pain level 0/10 with brace, 10/10 without brace. No skin changes.  Past Medical History  Diagnosis Date  . Hypertension   . Anemia     Current Outpatient Prescriptions on File Prior to Visit  Medication Sig Dispense Refill  . acetaminophen (TYLENOL) 500 MG tablet Take 1 tablet (500 mg total) by mouth every 6 (six) hours as needed. 30 tablet 0  . aspirin 81 MG tablet Take 81 mg by mouth daily.    . carvedilol (COREG) 12.5 MG tablet Take 1 tablet by mouth daily. Reported on 01/02/2016  5  . HYDROcodone-acetaminophen (NORCO) 5-325 MG tablet Take 1 tablet by mouth every 6 (six) hours as needed for moderate pain. 40 tablet 0  . losartan (COZAAR) 50 MG tablet Take 50 mg by mouth daily.    . naproxen (NAPROSYN) 500 MG tablet Take 1 tablet (500 mg total) by mouth 2 (two) times daily. 30 tablet 0   No current facility-administered medications on file prior to visit.    Past Surgical History  Procedure Laterality Date  . Myomectomy      Allergies  Allergen Reactions  . Codeine Itching  . Percocet [Oxycodone-Acetaminophen]     Social History   Social History  . Marital Status: Single    Spouse Name: N/A  . Number of Children: N/A  . Years of Education: N/A   Occupational History  . Not on file.   Social  History Main Topics  . Smoking status: Never Smoker   . Smokeless tobacco: Never Used  . Alcohol Use: No  . Drug Use: No  . Sexual Activity: Not on file   Other Topics Concern  . Not on file   Social History Narrative   Works as a Environmental education officer, which requires some heavy lifting.    Family History  Problem Relation Age of Onset  . Heart attack Mother     age "78-40's"  . Hypertension Mother   . Heart attack Sister     age "50-50's:  Marland Kitchen Hypertension Father   . Heart failure Father   . Cancer Father     prostate    BP 118/80 mmHg  Pulse 67  Ht 5\' 8"  (1.727 m)  Wt 193 lb (87.544 kg)  BMI 29.35 kg/m2  LMP 10/17/2014  Review of Systems: See HPI above.    Objective:  Physical Exam:  Gen: NAD  Right wrist: Mod swelling, no bruising dorsally.  No other deformity. TTP greatest 1st dorsal compartment and distal radius - minimal over DRUJ.  No other tenderness. FROM digits.  Did not test wrist ROM. NVI distally.  Left wrist: FROM without pain.    Assessment & Plan:  1. 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  Emphasized wearing the thumb spica brace at all times - can be careful and remove to wash area only.  We reviewed the increase in space of DRUJ - luckily she does not have any associated injuries to warrant surgical intervention (scapholunate dissociation, TFCC tear) and she should do well with bracing (discussed short arm cast as well).  Plan to f/u in 4 weeks for reevaluation.  After informed written consent patient was seated in chair in exam room.  Area overlying 1st dorsal compartment prepped with alcohol swab then injected with 0.5:0.38mL marcaine: depomedrol.  Patient tolerated procedure well without immediate complications.    independently reviewed radiographs and am concerned about the increased clear space of the  DRUJ along with tenderness here.  She has an old radiograph of the left wrist and right DRUJ is definitely wider on radiographs from 12/31/15.  Advised patient to continue with thumb spica brace but we will go ahead with MRI to assess for complete tear of ligament here.  Continue naprosyn, norco as needed.

## 2016-01-16 NOTE — Telephone Encounter (Signed)
No charge. 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

## 2016-01-16 NOTE — Telephone Encounter (Signed)
Pt was no show 01/14/16 9:30am for cpe, pt has not rescheduled, charge or no charge?

## 2016-02-08 ENCOUNTER — Encounter: Payer: Self-pay | Admitting: Family Medicine

## 2016-02-08 ENCOUNTER — Ambulatory Visit (INDEPENDENT_AMBULATORY_CARE_PROVIDER_SITE_OTHER): Payer: BLUE CROSS/BLUE SHIELD | Admitting: Family Medicine

## 2016-02-08 VITALS — BP 114/78 | HR 62 | Ht 68.0 in | Wt 172.0 lb

## 2016-02-08 DIAGNOSIS — M25531 Pain in right wrist: Secondary | ICD-10-CM

## 2016-02-08 DIAGNOSIS — S6991XD Unspecified injury of right wrist, hand and finger(s), subsequent encounter: Secondary | ICD-10-CM

## 2016-02-08 NOTE — Patient Instructions (Signed)
Continue with the thumb spica brace. Start therapy downstairs. Do home exercises as directed by them. Follow up with me if you want to do a repeat injection though you have improved enough that I would not recommend this right now.

## 2016-02-12 NOTE — Progress Notes (Signed)
PCP and consultation requested by: Leeanne Rio, PA-C  Subjective:   HPI: Patient is a 50 y.o. female here for right wrist injury.  1/6: Patient reports she was moving weight equipment on 12/31 when she accidentally hyperextended her right wrist all the way. Immediate pain - still with 8/10 sharp level of pain dorsal wrist. Had shot of depomedrol, radiographs 1/2 read as normal. Has been taking naprosyn and wearing thumb spica brace.. No prior injuries. Pain worse with any motions of the wrist. + swelling. No skin changes, fever.  1/17: Patient returns today following MRI result to consider dequervains injection. She reports not wearing brace at all times. Pain level 0/10 with brace, 10/10 without brace. No skin changes.  2/10: Patient reports improvement following injection though still with 4/10 dull pain in thumb. Brace has helped - still wearing. No skin changes, fever.  Past Medical History  Diagnosis Date  . Hypertension   . Anemia     Current Outpatient Prescriptions on File Prior to Visit  Medication Sig Dispense Refill  . aspirin 81 MG tablet Take 81 mg by mouth daily.    . carvedilol (COREG) 12.5 MG tablet Take 1 tablet by mouth daily. Reported on 01/02/2016  5  . losartan (COZAAR) 50 MG tablet Take 50 mg by mouth daily.     No current facility-administered medications on file prior to visit.    Past Surgical History  Procedure Laterality Date  . Myomectomy      Allergies  Allergen Reactions  . Codeine Itching  . Percocet [Oxycodone-Acetaminophen]     Social History   Social History  . Marital Status: Single    Spouse Name: N/A  . Number of Children: N/A  . Years of Education: N/A   Occupational History  . Not on file.   Social History Main Topics  . Smoking status: Never Smoker   . Smokeless tobacco: Never Used  . Alcohol Use: No  . Drug Use: No  . Sexual Activity: Not on file   Other Topics Concern  . Not on file   Social  History Narrative   Works as a Environmental education officer, which requires some heavy lifting.    Family History  Problem Relation Age of Onset  . Heart attack Mother     age "69-40's"  . Hypertension Mother   . Heart attack Sister     age "33-50's:  Marland Kitchen Hypertension Father   . Heart failure Father   . Cancer Father     prostate    BP 114/78 mmHg  Pulse 62  Ht 5\' 8"  (1.727 m)  Wt 172 lb (78.019 kg)  BMI 26.16 kg/m2  LMP 10/17/2014  Review of Systems: See HPI above.    Objective:  Physical Exam:  Gen: NAD  Right wrist: No swelling, no bruising dorsally.  No other deformity. Mild TTP 1st dorsal compartment and distal radius - minimal over DRUJ.  No other tenderness. FROM digits.  Did not test wrist ROM. NVI distally.  Left wrist: FROM without pain.    Assessment & Plan:  1. 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  She will start physical therapy, continue with thumb spica brace.  Consider repeat injection if not improving.  F/u in 6 weeks, sooner if she wants to repeat injection.

## 2016-02-12 NOTE — Assessment & Plan Note (Signed)
She continues to clinically improve - dequervains main issue clinically and noted on MRI.  She will start physical therapy, continue with thumb spica brace.  Consider repeat injection if not improving.  F/u in 6 weeks, sooner if she wants to repeat injection.

## 2016-02-24 DIAGNOSIS — E7800 Pure hypercholesterolemia, unspecified: Secondary | ICD-10-CM | POA: Insufficient documentation

## 2016-02-24 DIAGNOSIS — I10 Essential (primary) hypertension: Secondary | ICD-10-CM | POA: Insufficient documentation

## 2016-02-24 DIAGNOSIS — K219 Gastro-esophageal reflux disease without esophagitis: Secondary | ICD-10-CM | POA: Insufficient documentation

## 2016-02-24 DIAGNOSIS — L309 Dermatitis, unspecified: Secondary | ICD-10-CM | POA: Insufficient documentation

## 2016-02-26 ENCOUNTER — Ambulatory Visit: Payer: BLUE CROSS/BLUE SHIELD | Attending: Family Medicine | Admitting: Physical Therapy

## 2016-02-26 DIAGNOSIS — R279 Unspecified lack of coordination: Secondary | ICD-10-CM | POA: Diagnosis present

## 2016-02-26 DIAGNOSIS — M25531 Pain in right wrist: Secondary | ICD-10-CM

## 2016-02-26 DIAGNOSIS — M6281 Muscle weakness (generalized): Secondary | ICD-10-CM | POA: Insufficient documentation

## 2016-02-26 DIAGNOSIS — R29898 Other symptoms and signs involving the musculoskeletal system: Secondary | ICD-10-CM

## 2016-02-26 NOTE — Therapy (Signed)
[facility] [address]  [place], [place], [address] Phone: [phone]   Fax:  [phone]  Physical Therapy Evaluation  Patient Details  Name: Kelli Khan MRN: [id number] Date of Birth: [birth year] Referring Provider: Karlton Lemon, MD  Encounter Date: 02/26/2016      PT End of Session - 02/26/16 1630    Visit Number 1   Number of Visits 12   Date for PT Re-Evaluation 04/08/16   PT Start Time [id number]   PT Stop Time 1627   PT Time Calculation (min) 53 min   Activity Tolerance Patient tolerated treatment well   Behavior During Therapy [facility] for tasks assessed/performed      Past Medical History  Diagnosis Date  . Hypertension   . Anemia     Past Surgical History  Procedure Laterality Date  . Myomectomy      There were no vitals filed for this visit.  Visit Diagnosis:  Right wrist pain  Decreased grip strength of right hand      Subjective Assessment - 02/26/16 1535    Subjective Patient reports she was moving a heavy object on 12/29/15, when she thinks she overextended her R wrist and "pulled a tendon". Saw MD and was placed in thumb spica wrist brace. Has been wearing brace for ~8 weeks but still feels very weak and still has "remnants of pain". Typically painfree when in brace due to thumb immobilized, but pain returns when tries to use hand without brace.   Diagnostic tests 12/31/15 - R wrist x-ray: negative.  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 The TFCC appears normal. Mild degenerative changes at the first [facility] articulation.   Patient Stated Goals "To build my strength and psyche back up"   Currently in Pain? No/denies   Pain Score 0-No pain  Least 0/10, Avg 4/10, Worst - unable to determine   Pain Location Wrist   Pain Orientation Right  along lateral wrist and thumb   Pain Descriptors / Indicators --   "electrical sensation"   Pain Type Acute pain   Pain Radiating Towards n/a   Pain Onset More than a month ago   Pain Frequency Intermittent   Aggravating Factors  Use of R hand for personal hygiene, lifting objects   Pain Relieving Factors Immobilization in spint, ice, Naproxen   Effect of Pain on Daily Activities limits personal hygiene, dressing, grooming             [facility] PT Assessment - 02/26/16 1535    Assessment   Medical Diagnosis R wrist pain - deQuervain tenosynovitis   Referring Provider Karlton Lemon, MD   Onset Date/Surgical Date 12/29/15   Hand Dominance Right   Next MD Visit as needed   Prior Therapy none   Precautions   Required Braces or Orthoses Other Brace/Splint   Other Brace/Splint Thumb spica wrist splint   Balance Screen   Has the patient fallen in the past 6 months No   Has the patient had a decrease in activity level because of a fear of falling?  No   Is the patient reluctant to leave their home because of a fear of falling?  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   Leisure Exercising - walking on TM    Observation/Other Assessments   Focus on  Therapeutic Outcomes (FOTO)  Wrist - 43% (57% limitation); Predicted 62% (38% limitation)   ROM / Strength   AROM / PROM / Strength AROM;Strength   AROM   Overall AROM Comments R elbow and shoulder ROM WNL   AROM Assessment Site Forearm;Wrist;Finger;Thumb   Right/Left Forearm Right   Right Forearm Pronation 91 Degrees   Right Forearm Supination 81 Degrees   Right/Left Wrist Right   Right Wrist Extension 14 Degrees   Right Wrist Flexion 30 Degrees   Right Wrist Radial Deviation 16 Degrees   Right Wrist Ulnar Deviation 28 Degrees   Right/Left Finger Right   Right Composite Finger Extension 75%   Right Composite Finger Flexion 75%   Right/Left Thumb Right   Right Thumb Opposition Digit 2;Digit 3;Digit  4;Digit 5  pain at digits 4 & 5   Strength   Strength Assessment Site Elbow;Forearm;Wrist;Hand   Right/Left Elbow Right   Right Elbow Flexion 4+/5   Right Elbow Extension 4/5   Right/Left Forearm Right   Right Forearm Pronation 3+/5   Right Forearm Supination 3+/5   Right/Left Wrist Right   Right Wrist Flexion 3+/5  pain   Right Wrist Extension 3+/5   Right Wrist Radial Deviation 3+/5   Right Wrist Ulnar Deviation 3+/5   Right/Left hand Right;Left   Right Hand Gross Grasp Impaired   Right Hand Grip (lbs) 0   Right Hand Lateral Pinch 3 lbs   Right Hand 3 Point Pinch 2 lbs   Left Hand Gross Grasp Functional   Left Hand Grip (lbs) 14   Left Hand Lateral Pinch 13 lbs   Left Hand 3 Point Pinch 10 lbs        Today's Treatment  TherEx R Finger gross flexion to abduction x10 R DIP/PIP finger flexion x10 R Thumb to finger opposition x5 R Wrist flexion/extension and radial/ulnar deviation AROM x10 R Forearm pronation/supination AROM x10   Patient educated on use of iontophoresis for pain/inflammation management but pt declined ionto patch today         PT Education - 02/26/16 1638    Education provided Yes   Education Details Eval findings and POC, Initial HEP, Use of ice and NSAIDS for pain/inflammation   Person(s) Educated Patient   Methods Explanation;Demonstration;Handout   Comprehension Verbalized understanding;Returned demonstration;Need further instruction          PT Short Term Goals - 02/26/16 1826    PT SHORT TERM GOAL #1   Title Pt will be independent with initial HEP by 03/18/16   Status New           PT Long Term Goals - 02/26/16 1826    PT LONG TERM GOAL #1   Title Pt will be independent with most recent HEP by 04/08/16   Status New   PT LONG TERM GOAL #2   Title Pt will demonstrate R wrist and forearm ROM WFL without pain by 04/08/16   Status New   PT LONG TERM GOAL #3   Title Pt will demonstrate R wrist strength 4/5 or greater by 04/08/16    Status New   PT LONG TERM GOAL #4   Title Pt will demonstrate R grip strength and pinch strength at least 75% of L hand by 04/08/16   Status New   PT LONG TERM GOAL #5   Title Pt will report functional use of R hand for ADL's without increased pain by 04/08/16   Status New  Plan - 02/26/16 1651    Clinical Impression Statement Patient is a 50 y/o female who presents to OP PT with 2 month h/o R wrist pain consistent with deQuervain tenosynovitis. Patient has been wearing a thumb spica splint for 8 weeks and demonstrates decreased forearm and wrist AROM, along with decreased finger opposition. R forearm and wrist strength significantly limited with grip strength not registering on Jamar dynomometer and pinch strength of only 2-3 lbs. Patient reports typically painfree when wearing thumb spica splint, and has avoided trying using R hand due to fear of pain.   Pt will benefit from skilled therapeutic intervention in order to improve on the following deficits Pain;Decreased range of motion;Impaired flexibility;Decreased strength;Impaired UE functional use   Rehab Potential Good   PT Frequency 2x / week   PT Duration 6 weeks   PT Treatment/Interventions Patient/family education;Therapeutic exercise;Manual techniques;Passive range of motion;Taping;Therapeutic activities;Electrical Stimulation;Cryotherapy;Iontophoresis 4mg /ml Dexamethasone;Ultrasound;ADLs/Self Care Home Management   PT Next Visit Plan Review HEP and add self-stretches for any remaining restrictions in wrist ROM; Initiate light strengthening for wrist flexion/extension/radial deviation, grip strength (putty?) and finger extension; Kinesiotaping for deQuarvain's; Modalities including ionto patch (inlcuded in initial MD order) PRN   Consulted and Agree with Plan of Care Patient         Problem List Patient Active Problem List   Diagnosis Date Noted  . Episodic lightheadedness 01/02/2016  . Pressure in head  01/02/2016  . Right wrist injury 01/02/2016    Percival Spanish, PT, MPT 02/26/2016, 6:45 PM  [facility] [address]  [place], [place], [address] Phone: [phone]   Fax:  [phone]  Name: Aiyla Perdue MRN: [id number] Date of Birth: [birth year]

## 2016-03-05 ENCOUNTER — Ambulatory Visit: Payer: BLUE CROSS/BLUE SHIELD

## 2016-03-07 ENCOUNTER — Ambulatory Visit: Payer: BLUE CROSS/BLUE SHIELD | Admitting: Physical Therapy

## 2016-03-10 ENCOUNTER — Ambulatory Visit: Payer: BLUE CROSS/BLUE SHIELD | Attending: Family Medicine | Admitting: Physical Therapy

## 2016-03-10 DIAGNOSIS — R279 Unspecified lack of coordination: Secondary | ICD-10-CM | POA: Insufficient documentation

## 2016-03-10 DIAGNOSIS — R29898 Other symptoms and signs involving the musculoskeletal system: Secondary | ICD-10-CM | POA: Insufficient documentation

## 2016-03-10 DIAGNOSIS — M25531 Pain in right wrist: Secondary | ICD-10-CM | POA: Insufficient documentation

## 2016-03-10 DIAGNOSIS — M6281 Muscle weakness (generalized): Secondary | ICD-10-CM | POA: Insufficient documentation

## 2016-03-14 ENCOUNTER — Ambulatory Visit: Payer: BLUE CROSS/BLUE SHIELD

## 2016-03-17 ENCOUNTER — Ambulatory Visit: Payer: BLUE CROSS/BLUE SHIELD

## 2016-03-19 ENCOUNTER — Ambulatory Visit: Payer: BLUE CROSS/BLUE SHIELD

## 2016-03-19 DIAGNOSIS — R279 Unspecified lack of coordination: Secondary | ICD-10-CM

## 2016-03-19 DIAGNOSIS — M25531 Pain in right wrist: Secondary | ICD-10-CM | POA: Diagnosis present

## 2016-03-19 DIAGNOSIS — R29898 Other symptoms and signs involving the musculoskeletal system: Secondary | ICD-10-CM

## 2016-03-19 DIAGNOSIS — M6281 Muscle weakness (generalized): Secondary | ICD-10-CM | POA: Diagnosis present

## 2016-03-19 NOTE — Therapy (Addendum)
[facility] [address]  [place], [place], [address] Phone: [phone]   Fax:  [phone]  Physical Therapy Treatment  Patient Details  Name: Kelli Khan MRN: [id number] Date of Birth: 08-08-1966 Referring Provider: Karlton Lemon, MD  Encounter Date: 03/19/2016      PT End of Session - 03/19/16 1404    Visit Number 2   Number of Visits 12   Date for PT Re-Evaluation 04/08/16   PT Start Time 1104   PT Stop Time 1150   PT Time Calculation (min) 46 min   Activity Tolerance Patient tolerated treatment well   Behavior During Therapy [facility] for tasks assessed/performed      Past Medical History  Diagnosis Date  . Hypertension   . Anemia     Past Surgical History  Procedure Laterality Date  . Myomectomy      There were no vitals filed for this visit.  Visit Diagnosis:  Right wrist pain  Decreased grip strength of right hand  Wrist weakness  Lack of coordination      Subjective Assessment - 03/19/16 1151    Subjective (p) Pt. reports 4/10 R wrist pain initially.  No other pain or complaints reported.  Pt. reports performing HEP everyday since evaluations.     Patient Stated Goals (p) "To build my strength and psyche back up"   Currently in Pain? (p) No/denies   Pain Score (p) 0-No pain   Pain Location (p) Wrist   Pain Orientation (p) Right   Pain Descriptors / Indicators (p) Burning   Pain Type (p) Acute pain   Pain Radiating Towards (p) n/a   Pain Onset (p) More than a month ago   Pain Frequency (p) Intermittent   Aggravating Factors  (p) use of R hand for personal hygiene, lifting objects.   Pain Relieving Factors (p) immobilization in splint, ice, naproxen   Multiple Pain Sites (p) No       Today's Treatment  TherEx UBE 60"/60" forward / backward  While seated with R arm elevated on table:     R wrist extension self stretch x 30 sec      R wrist flexion self stretch x 30 sec      R  wrist ulnar deviation with finger / thumb squeeze (Finkelstein) x 30 sec; pt. with instant pain in finkelstein test position      R wrist radial deviation self stretch x 30 sec      R Thumb to finger opposition x 10; no increase of pain with this      R Wrist flexion/extension 1# x 15 reps; some pain with extension; extension terminated after 8 reps      R wrist radial/ulnar deviation with #2 x 15 reps      R Forearm pronation/supination x 15 #1 dumbbell; attempted 2# dumbbell however went to #1 due to pain     R grip strength x 15 reps with squeeze handle easiest setting; Pt. unable to control grip release each rep    * pt. reported increased R wrist / pain through "anatomical snuff box" area for ~ 5 min following therex  R forearm / wrist ROM assessment         [facility] PT Assessment - 03/19/16 1402    AROM   AROM Assessment Site Forearm   Right/Left Forearm Right   Right Forearm Pronation 90 Degrees   Right Forearm Supination 86 Degrees   Right/Left Wrist Right  Right Wrist Extension 50 Degrees   Right Wrist Flexion 66 Degrees   Right Wrist Radial Deviation 24 Degrees   Right Wrist Ulnar Deviation 34 Degrees                PT Short Term Goals - 03/19/16 1101    PT SHORT TERM GOAL #1   Title Pt will be independent with initial HEP by 03/18/16  03/19/16: Pt. verbalized demo'd independence with initial HEP.     Status Achieved           PT Long Term Goals - 03/19/16 1103    PT LONG TERM GOAL #1   Title Pt will be independent with most recent HEP by 04/08/16   Status Ongoing   PT LONG TERM GOAL #2   Title Pt will demonstrate R wrist and forearm ROM WFL without pain by 04/08/16   Status Ongoing   PT LONG TERM GOAL #3   Title Pt will demonstrate R wrist strength 4/5 or greater by 04/08/16   Status Ongoing   PT LONG TERM GOAL #4   Title Pt will demonstrate R grip strength and pinch strength at least 75% of L hand by 04/08/16   Status Ongoing   PT LONG TERM GOAL #5    Title Pt will report functional use of R hand for ADL's without increased pain by 04/08/16   Status Ongoing               Plan - 03/19/16 1416    Clinical Impression Statement Pt. arriving to PT for first time since 02/26/16; Pt. R wrist AROM and initial pain improved from eval. ROM assessment performed to R wrist with AROM much improved since initial eval; pt. reported daily adherence to initial HEP over last four weeks however only able to tolerate minimal to no weight with therex at L wrist; pt. demo'd R grip strength 13#, L grip strength 27# despite being R hand dominant; pt. performed all therex with poor technique requiing frequent cues to "slow down" performance of activities.  Pt. reports stopped using R wrist brace 10 days ago.  Pt. instructed on importance of attending regularly scheduled PT appointments and that one appointment a month would not provide the optimal outcome for recovery of pt. condition.       PT Next Visit Plan kinesiotaping for deQuarvain's; modalities including ionto patch (included in initial MD order) PRN; mild R wrist ROM / stretching        Problem List Patient Active Problem List   Diagnosis Date Noted  . Episodic lightheadedness 01/02/2016  . Pressure in head 01/02/2016  . Right wrist injury 01/02/2016    Bess Harvest, PTA 03/19/2016, 2:26 PM  [facility] [address]  [place], [place], [address] Phone: [phone]   Fax:  [phone]  Name: Kelli Khan MRN: [id number] Date of Birth: 1965-12-31    PHYSICAL THERAPY DISCHARGE SUMMARY  Visits from Start of Care: 2  Current functional level related to goals / functional outcomes:  Pt only attended evaluation and 1 treatment session with 4 cancellations and 2 no shows. When contacted regarding her latest "no show" on 03/24/16, pt stating she thought she was discharged and asked for PT to proceed with D/C. STG of pt independence with  the initial HEP was met, but none of her LTG's were met due to poor compliance with attendance to scheduled therapy sessions.   Remaining deficits:  Unable to assess  due to failure to return to PT   Education / Equipment:  Initial HEP  Plan: Patient agrees to discharge.  Patient goals were partially met. Patient is being discharged due to not returning since the last visit.  ?????       Percival Spanish, PT, MPT 03/27/2016, 10:41 AM  [facility] [address]  [place], [place], [address] Phone: [phone]   Fax:  [phone]

## 2016-03-24 ENCOUNTER — Ambulatory Visit: Payer: BLUE CROSS/BLUE SHIELD

## 2016-03-26 ENCOUNTER — Ambulatory Visit: Payer: BLUE CROSS/BLUE SHIELD | Admitting: Physical Therapy

## 2016-03-31 ENCOUNTER — Ambulatory Visit: Payer: BLUE CROSS/BLUE SHIELD

## 2016-04-03 ENCOUNTER — Ambulatory Visit: Payer: BLUE CROSS/BLUE SHIELD | Admitting: Physical Therapy

## 2016-04-07 ENCOUNTER — Ambulatory Visit: Payer: BLUE CROSS/BLUE SHIELD

## 2016-04-10 ENCOUNTER — Ambulatory Visit: Payer: BLUE CROSS/BLUE SHIELD | Admitting: Physical Therapy

## 2016-05-28 DIAGNOSIS — G43109 Migraine with aura, not intractable, without status migrainosus: Secondary | ICD-10-CM | POA: Insufficient documentation

## 2016-09-27 IMAGING — MR MR WRIST*R* W/O CM
6 series · 40 of 40 positions shown · non-contrast
Comparison: Radiographs 12/31/2015.

CLINICAL DATA: Wrist and base of thumb pain for several weeks.
Possible lifting injury. Evaluate for DRUJ dislocation or torn
ligament.

EXAM:
MR OF THE RIGHT WRIST WITHOUT CONTRAST
TECHNIQUE: Multiplanar, multisequence MR imaging of the right wrist was
performed. No intravenous contrast was administered.

[Series 4: T1 · axial · 3.0mm · 0.38mm/px · z∈[-44,+35]mm · 9 of 26 slices shown (1 of 2)]
[im 1/26]
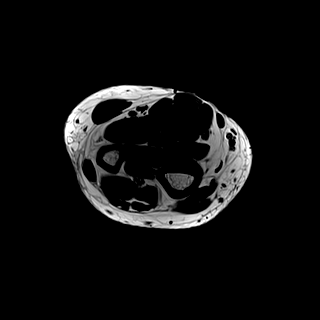
[im 4/26]
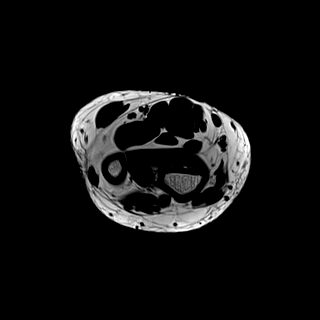
[im 7/26]
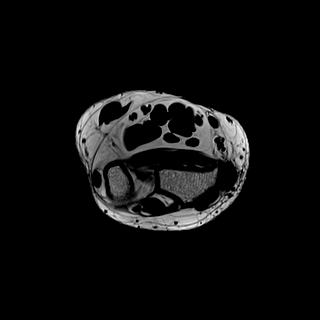
[im 10/26]
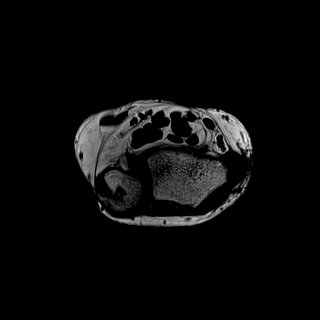
[im 13/26]
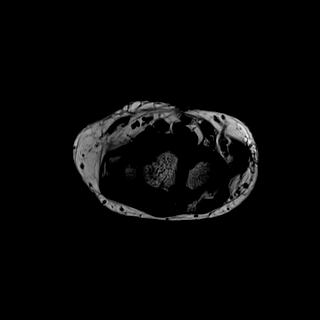
[im 16/26]
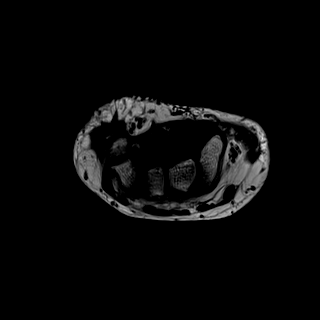
[im 19/26]
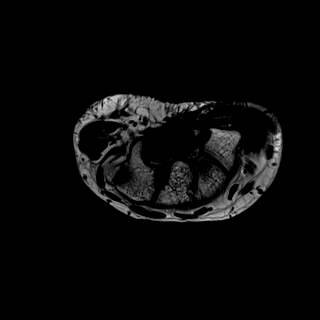
[im 22/26]
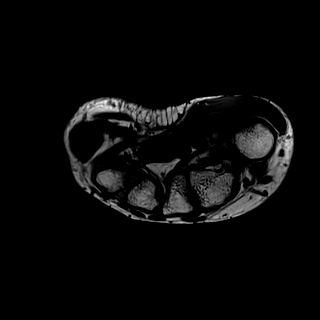
[im 26/26]
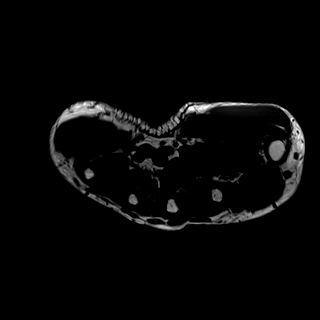

[Series 5: T2 fat-sat · axial · 3.0mm · 0.38mm/px · z∈[-44,+35]mm · 8 of 26 slices shown (1 of 2)]
[im 1/26]
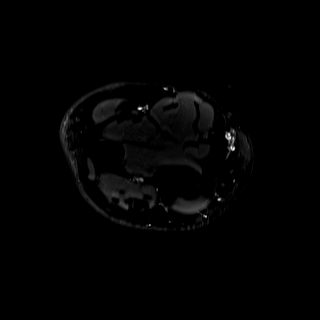
[im 4/26]
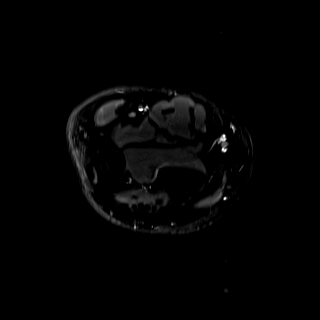
[im 8/26]
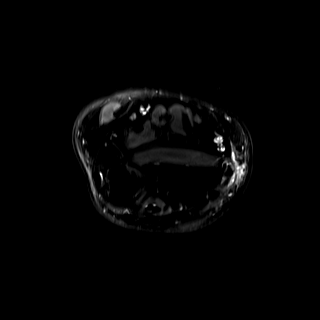
[im 11/26]
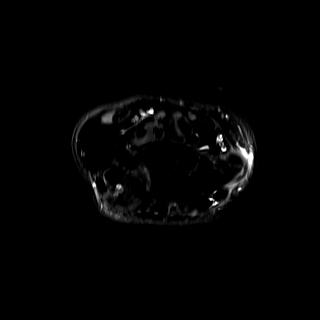
[im 15/26]
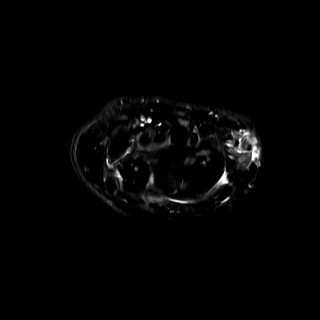
[im 18/26]
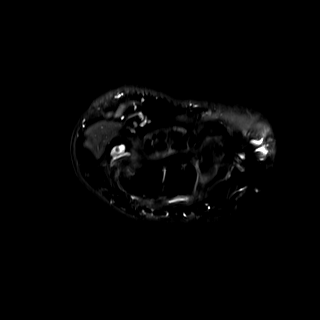
[im 22/26]
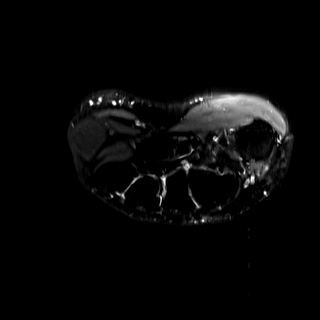
[im 26/26]
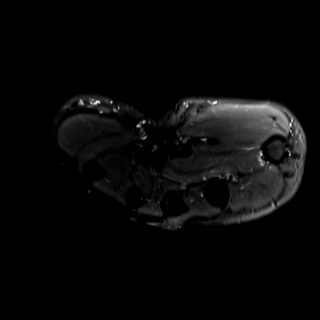

[Series 6: T2 fat-sat · coronal · 3.0mm · 0.38mm/px · 5 of 17 slices shown (2 of 2)]
[im 1/17]
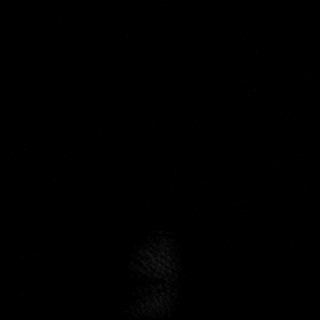
[im 5/17]
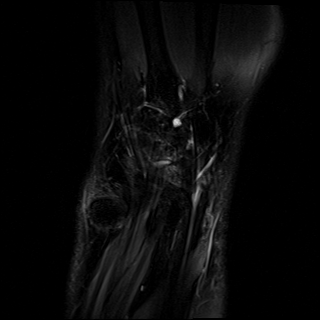
[im 9/17]
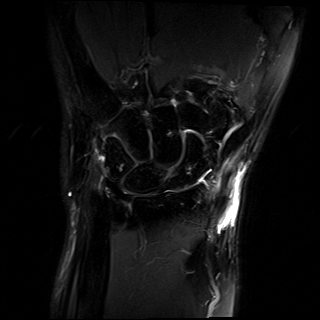
[im 13/17]
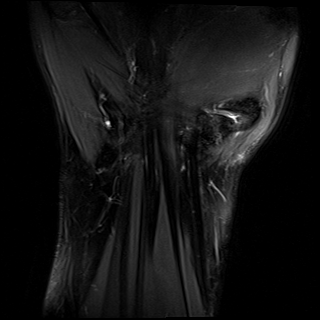
[im 17/17]
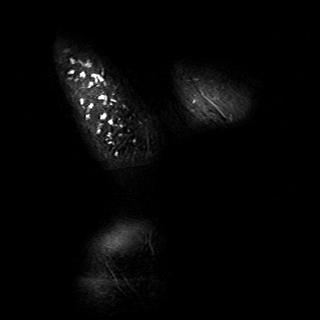

[Series 7: T1 · coronal · 3.0mm · 0.38mm/px · 5 of 17 slices shown (2 of 2)]
[im 1/17]
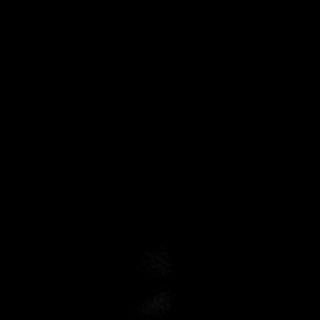
[im 5/17]
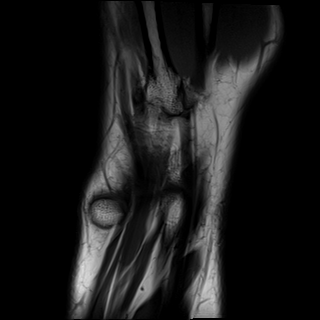
[im 9/17]
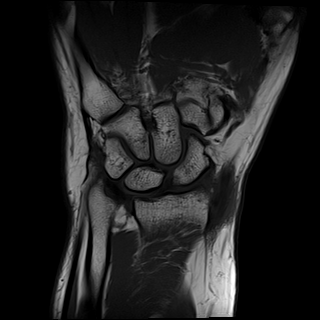
[im 13/17]
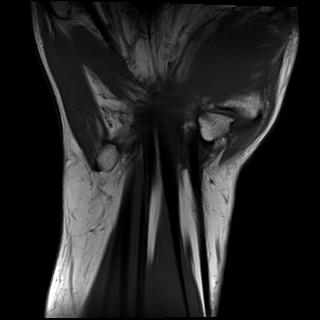
[im 17/17]
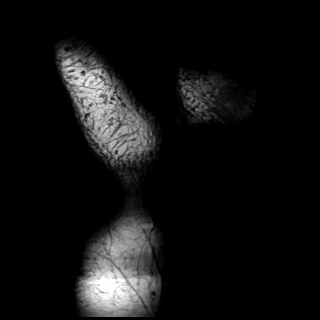

[Series 8: PD fat-sat · sagittal · 3.0mm · 0.38mm/px · 8 of 24 slices shown (1 of 2)]
[im 1/24]
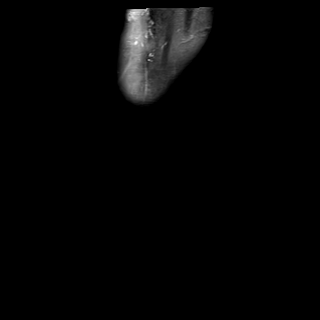
[im 4/24]
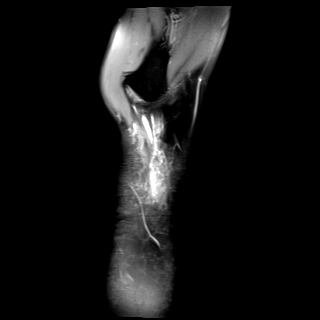
[im 7/24]
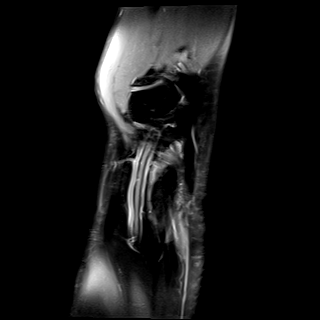
[im 10/24]
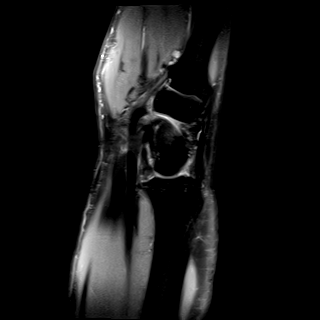
[im 14/24]
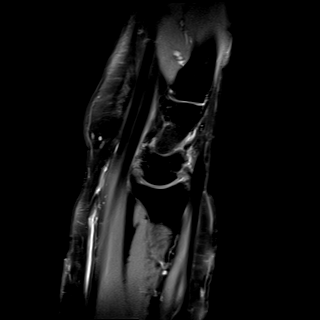
[im 17/24]
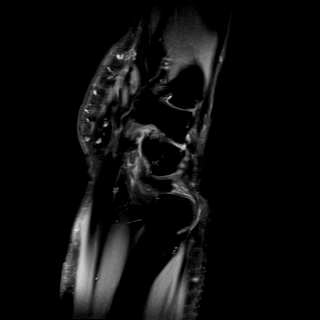
[im 20/24]
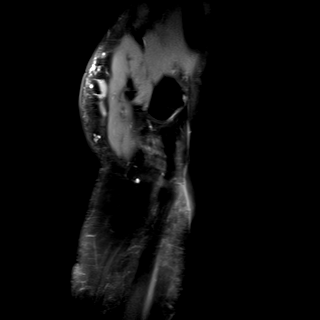
[im 24/24]
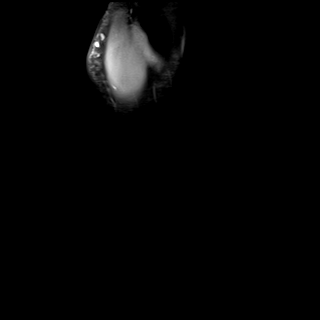

[Series 9: PD fat-sat · coronal · 3.0mm · 0.38mm/px · 5 of 17 slices shown (2 of 2)]
[im 1/17]
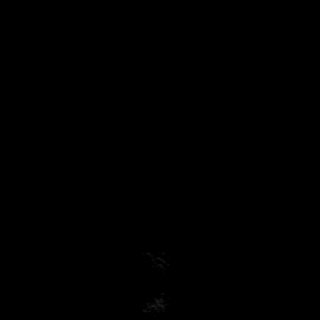
[im 5/17]
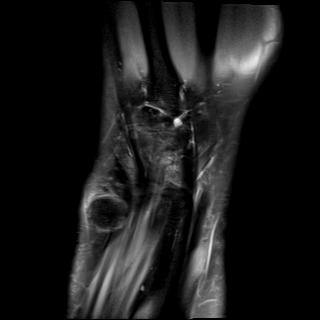
[im 9/17]
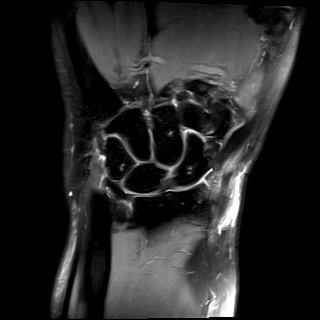
[im 13/17]
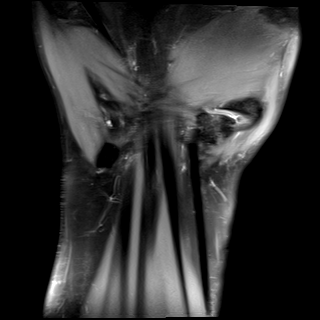
[im 17/17]
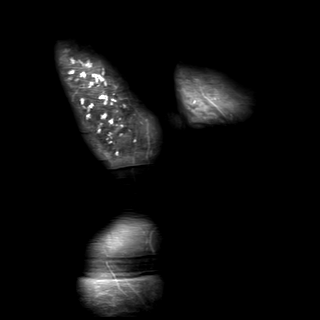

[40 of 40 positions shown; findings below may reference images not displayed]

FINDINGS: Ligaments: The scapholunate and lunotriquetral ligaments appear
intact.

Triangular fibrocartilage: The triangular fibrocartilage complex
appears intact. The ulnar variance is neutral.

Tendons: The extensor pollicis brevis and abductor pollicis longus
tendons demonstrate moderate sheath fluid consistent with de
Quervain tenosynovitis. The additional extensor and flexor tendons
appear normal.

Carpal tunnel/median nerve: Unremarkable.

Guyon's canal: Unremarkable.

Joint/cartilage: No significant fluid within any of the wrist joint
compartments. There are mild degenerative changes at the first
carpometacarpal articulation.

Bones/carpal alignment: The carpal bone alignment is normal.Relative
to the sigmoid notch of the distal radius, the distal ulna is mildly
subluxed posteriorly, more than typically seen with normal wrist
pronation. There is no significant fluid in the distal right ulnar
joint.

Other: The periarticular soft tissues appear normal. No ganglia are
identified.
IMPRESSION: 1. The primary abnormality is moderate deQuervain tenosynovitis.
2. Possible distal radioulnar joint instability. The TFCC appears
normal.
3. Mild degenerative changes at the first [facility] articulation.

## 2016-09-30 DIAGNOSIS — Z6835 Body mass index (BMI) 35.0-35.9, adult: Secondary | ICD-10-CM | POA: Insufficient documentation

## 2016-09-30 DIAGNOSIS — Z0000 Encounter for general adult medical examination without abnormal findings: Secondary | ICD-10-CM | POA: Insufficient documentation

## 2017-09-16 ENCOUNTER — Emergency Department (HOSPITAL_BASED_OUTPATIENT_CLINIC_OR_DEPARTMENT_OTHER)
Admission: EM | Admit: 2017-09-16 | Discharge: 2017-09-16 | Disposition: A | Discharge status: Home / Self Care | Payer: BLUE CROSS/BLUE SHIELD | Attending: Emergency Medicine | Admitting: Emergency Medicine

## 2017-09-16 ENCOUNTER — Encounter (HOSPITAL_BASED_OUTPATIENT_CLINIC_OR_DEPARTMENT_OTHER): Payer: Self-pay

## 2017-09-16 ENCOUNTER — Emergency Department (HOSPITAL_BASED_OUTPATIENT_CLINIC_OR_DEPARTMENT_OTHER): Payer: BLUE CROSS/BLUE SHIELD

## 2017-09-16 DIAGNOSIS — W228XXA Striking against or struck by other objects, initial encounter: Secondary | ICD-10-CM | POA: Diagnosis not present

## 2017-09-16 DIAGNOSIS — I10 Essential (primary) hypertension: Secondary | ICD-10-CM | POA: Insufficient documentation

## 2017-09-16 DIAGNOSIS — Y999 Unspecified external cause status: Secondary | ICD-10-CM | POA: Diagnosis not present

## 2017-09-16 DIAGNOSIS — Y939 Activity, unspecified: Secondary | ICD-10-CM | POA: Diagnosis not present

## 2017-09-16 DIAGNOSIS — Y929 Unspecified place or not applicable: Secondary | ICD-10-CM | POA: Insufficient documentation

## 2017-09-16 DIAGNOSIS — S9032XA Contusion of left foot, initial encounter: Secondary | ICD-10-CM | POA: Diagnosis not present

## 2017-09-16 DIAGNOSIS — S99922A Unspecified injury of left foot, initial encounter: Secondary | ICD-10-CM | POA: Diagnosis present

## 2017-09-16 NOTE — ED Provider Notes (Signed)
Emergency Department Provider Note   I have reviewed the triage vital signs and the nursing notes.   HISTORY  Chief Complaint Foot Injury   HPI Kelli Khan is a 51 y.o. female with past medical history of hypertension presents emergency department for evaluation of left foot pain after dropping a bed rail on it yesterday. She describes tenderness over the dorsum of the foot. No tingling or numbness. Some pain with walking. He describes the pain as mild to moderate. No pain in the ankle or knee. No bleeding. No fever or chills. Pain slightly worse with walking. No radiation.    Past Medical History:  Diagnosis Date  . Anemia   . Hypertension     Patient Active Problem List   Diagnosis Date Noted  . Episodic lightheadedness 01/02/2016  . Pressure in head 01/02/2016  . Right wrist injury 01/02/2016    Past Surgical History:  Procedure Laterality Date  . MYOMECTOMY      Current Outpatient Rx  . Order #: [id number] Class: Historical Med  . Order #: [id number] Class: Historical Med  . Order #: [id number] Class: Historical Med  . Order #: [id number] Class: Historical Med    Allergies Codeine and Percocet [oxycodone-acetaminophen]  Family History  Problem Relation Age of Onset  . Heart attack Mother        age "66-40's"  . Hypertension Mother   . Heart attack Sister        age "71-50's:  Marland Kitchen Hypertension Father   . Heart failure Father   . Cancer Father        prostate    Social History Social History  Substance Use Topics  . Smoking status: Never Smoker  . Smokeless tobacco: Never Used  . Alcohol use No    Review of Systems  Constitutional: No fever/chills Eyes: No visual changes. ENT: No sore throat. Cardiovascular: Denies chest pain. Respiratory: Denies shortness of breath. Gastrointestinal: No abdominal pain.  No nausea, no vomiting.  No diarrhea. No constipation. Genitourinary: Negative for dysuria. Musculoskeletal: Negative for back pain. Positive left  foot pain.  Skin: Negative for rash. Neurological: Negative for headaches, focal weakness or numbness.  10-point ROS otherwise negative.  ____________________________________________   PHYSICAL EXAM:  VITAL SIGNS: ED Triage Vitals  Enc Vitals Group     BP 09/16/17 1313 (!) 151/90     Pulse Rate 09/16/17 1313 81     Resp 09/16/17 1313 16     Temp 09/16/17 1313 98.3 F (36.8 C)     Temp Source 09/16/17 1313 Oral     SpO2 09/16/17 1313 100 %     Weight 09/16/17 1313 200 lb (90.7 kg)     Height 09/16/17 1313 5\' 7"  (1.702 m)     Pain Score 09/16/17 1311 9   Constitutional: Alert and oriented. Well appearing and in no acute distress. Eyes: Conjunctivae are normal.  Head: Atraumatic. Nose: No congestion/rhinnorhea. Mouth/Throat: Mucous membranes are moist.  Neck: No stridor.   Cardiovascular: Normal rate, regular rhythm. Good peripheral circulation. Grossly normal heart sounds.   Respiratory: Normal respiratory effort.  No retractions. Lungs CTAB. Gastrointestinal: Soft and nontender. No distention.  Musculoskeletal: No lower extremity tenderness nor edema. No gross deformities of extremities. Mild swelling over the dorsum of the left foot. No midfoot tenderness to palpation. No ankle or proximal fibular tenderness to palpation.  Neurologic:  Normal speech and language. No gross focal neurologic deficits are appreciated.  Skin:  Skin is warm, dry and intact. No rash noted.  ____________________________________________  RADIOLOGY  Dg Foot Complete Left  Result Date: 09/16/2017 CLINICAL DATA:  Pain after dropping bed rail on foot EXAM: LEFT FOOT - COMPLETE 3+ VIEW COMPARISON:  January 12, 2014 FINDINGS: Frontal, oblique, and lateral views were obtained. There is soft tissue swelling dorsally. There is no appreciable fracture or dislocation. Joint spaces appear normal. No erosive change. IMPRESSION: Soft tissue swelling dorsally. No fracture or dislocation. No evident arthropathy.  Electronically Signed   By: Lowella Grip III M.D.   On: 09/16/2017 13:33    ____________________________________________   PROCEDURES  Procedure(s) performed:   Procedures  None ____________________________________________   INITIAL IMPRESSION / ASSESSMENT AND PLAN / ED COURSE  Pertinent labs & imaging results that were available during my care of the patient were reviewed by me and considered in my medical decision making (see chart for details).  Patient resents emergency department for evaluation of left foot pain. She has mild swelling there with likely contusion. X-ray of the foot negative for fracture or dislocation. Plan for supportive shoe, NSAIDs, and RICE. Will follow with PCP PRN.   At this time, I do not feel there is any life-threatening condition present. I have reviewed and discussed all results (EKG, imaging, lab, urine as appropriate), exam findings with patient. I have reviewed nursing notes and appropriate previous records.  I feel the patient is safe to be discharged home without further emergent workup. Discussed usual and customary return precautions. Patient and family (if present) verbalize understanding and are comfortable with this plan.  Patient will follow-up with their primary care provider. If they do not have a primary care provider, information for follow-up has been provided to them. All questions have been answered.  ____________________________________________  FINAL CLINICAL IMPRESSION(S) / ED DIAGNOSES  Final diagnoses:  Contusion of left foot, initial encounter     MEDICATIONS GIVEN DURING THIS VISIT:  Medications - No data to display   NEW OUTPATIENT MEDICATIONS STARTED DURING THIS VISIT:  None   Note:  This document was prepared using Dragon voice recognition software and may include unintentional dictation errors.  Nanda Quinton, MD Emergency Medicine    Long, [facility], MD 09/16/17 [phone]

## 2017-09-16 NOTE — Discharge Instructions (Signed)

## 2017-09-16 NOTE — ED Triage Notes (Signed)
Pt states she dropped bed rail on left foot yesterday-NAD-limping gait

## 2017-12-11 ENCOUNTER — Emergency Department (HOSPITAL_BASED_OUTPATIENT_CLINIC_OR_DEPARTMENT_OTHER)
Admission: EM | Admit: 2017-12-11 | Discharge: 2017-12-11 | Disposition: A | Discharge status: Home / Self Care | Payer: BLUE CROSS/BLUE SHIELD | Attending: Emergency Medicine | Admitting: Emergency Medicine

## 2017-12-11 ENCOUNTER — Emergency Department (HOSPITAL_BASED_OUTPATIENT_CLINIC_OR_DEPARTMENT_OTHER): Payer: BLUE CROSS/BLUE SHIELD

## 2017-12-11 ENCOUNTER — Other Ambulatory Visit: Payer: Self-pay

## 2017-12-11 ENCOUNTER — Encounter (HOSPITAL_BASED_OUTPATIENT_CLINIC_OR_DEPARTMENT_OTHER): Payer: Self-pay | Admitting: *Deleted

## 2017-12-11 DIAGNOSIS — Y9389 Activity, other specified: Secondary | ICD-10-CM | POA: Diagnosis not present

## 2017-12-11 DIAGNOSIS — M25531 Pain in right wrist: Secondary | ICD-10-CM | POA: Insufficient documentation

## 2017-12-11 DIAGNOSIS — Z79899 Other long term (current) drug therapy: Secondary | ICD-10-CM | POA: Diagnosis not present

## 2017-12-11 DIAGNOSIS — Y998 Other external cause status: Secondary | ICD-10-CM | POA: Diagnosis not present

## 2017-12-11 DIAGNOSIS — Y92410 Unspecified street and highway as the place of occurrence of the external cause: Secondary | ICD-10-CM | POA: Diagnosis not present

## 2017-12-11 DIAGNOSIS — M7918 Myalgia, other site: Secondary | ICD-10-CM | POA: Insufficient documentation

## 2017-12-11 DIAGNOSIS — I10 Essential (primary) hypertension: Secondary | ICD-10-CM | POA: Insufficient documentation

## 2017-12-11 DIAGNOSIS — Z7982 Long term (current) use of aspirin: Secondary | ICD-10-CM | POA: Insufficient documentation

## 2017-12-11 MED ORDER — CYCLOBENZAPRINE HCL 5 MG PO TABS: 5.0000 mg | ORAL_TABLET | Freq: Every evening | ORAL | 0 refills | Status: DC | PRN

## 2017-12-11 MED ORDER — KETOROLAC TROMETHAMINE 15 MG/ML IJ SOLN: 7.5000 mg | Freq: Once | INTRAMUSCULAR | Status: DC

## 2017-12-11 MED ORDER — KETOROLAC TROMETHAMINE 15 MG/ML IJ SOLN
INTRAMUSCULAR | Status: AC
  Filled 2017-12-11: qty 1

## 2017-12-11 MED ORDER — IBUPROFEN 800 MG PO TABS
800.0000 mg | ORAL_TABLET | Freq: Once | ORAL | Status: AC
  Administered 2017-12-11: 800 mg via ORAL
  Filled 2017-12-11: qty 1

## 2017-12-11 MED ORDER — NAPROXEN 500 MG PO TABS: 500.0000 mg | ORAL_TABLET | Freq: Two times a day (BID) | ORAL | 0 refills | Status: DC

## 2017-12-11 MED ORDER — KETOROLAC TROMETHAMINE 15 MG/ML IJ SOLN
7.5000 mg | Freq: Once | INTRAMUSCULAR | Status: AC
  Administered 2017-12-11: 7.5 mg via INTRAMUSCULAR

## 2017-12-11 NOTE — Discharge Instructions (Signed)
Take naproxen 2 times a day with meals.  Do not take other anti-inflammatories at the same time open (Advil, Motrin, ibuprofen, Aleve). You may supplement with Tylenol if you need further pain control. Use Flexeril as needed for muscle stiffness or soreness.  Have caution, this may make you tired or groggy.  Do not drive or operate heavy machinery while taking this medicine. Use ice packs or heating pads to help control your pain. Use the wrist splint to help with pain. You likely have continued muscle stiffness and soreness over the next couple days.  Follow-up with primary care in 1 week if your symptoms are not improving. Return to the emergency room if you develop vision changes, vomiting, slurred speech, numbness, loss of bowel or bladder control, or any new or worsening symptoms.
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
## 2017-12-15 DIAGNOSIS — K5909 Other constipation: Secondary | ICD-10-CM | POA: Insufficient documentation

## 2017-12-21 DIAGNOSIS — M545 Low back pain, unspecified: Secondary | ICD-10-CM | POA: Insufficient documentation

## 2017-12-21 DIAGNOSIS — R232 Flushing: Secondary | ICD-10-CM | POA: Insufficient documentation

## 2017-12-21 DIAGNOSIS — R700 Elevated erythrocyte sedimentation rate: Secondary | ICD-10-CM | POA: Insufficient documentation

## 2017-12-21 DIAGNOSIS — M25511 Pain in right shoulder: Secondary | ICD-10-CM | POA: Insufficient documentation

## 2018-05-14 DIAGNOSIS — D259 Leiomyoma of uterus, unspecified: Secondary | ICD-10-CM | POA: Insufficient documentation

## 2018-05-14 DIAGNOSIS — D649 Anemia, unspecified: Secondary | ICD-10-CM

## 2018-05-14 HISTORY — DX: Anemia, unspecified: D64.9

## 2018-06-20 ENCOUNTER — Encounter (HOSPITAL_BASED_OUTPATIENT_CLINIC_OR_DEPARTMENT_OTHER): Payer: Self-pay | Admitting: Emergency Medicine

## 2018-06-20 ENCOUNTER — Other Ambulatory Visit: Payer: Self-pay

## 2018-06-20 ENCOUNTER — Emergency Department (HOSPITAL_BASED_OUTPATIENT_CLINIC_OR_DEPARTMENT_OTHER): Payer: BLUE CROSS/BLUE SHIELD

## 2018-06-20 ENCOUNTER — Emergency Department (HOSPITAL_BASED_OUTPATIENT_CLINIC_OR_DEPARTMENT_OTHER)
Admission: EM | Admit: 2018-06-20 | Discharge: 2018-06-20 | Disposition: A | Discharge status: Home / Self Care | Payer: BLUE CROSS/BLUE SHIELD | Attending: Emergency Medicine | Admitting: Emergency Medicine

## 2018-06-20 DIAGNOSIS — Z7982 Long term (current) use of aspirin: Secondary | ICD-10-CM | POA: Diagnosis not present

## 2018-06-20 DIAGNOSIS — M25571 Pain in right ankle and joints of right foot: Secondary | ICD-10-CM | POA: Insufficient documentation

## 2018-06-20 DIAGNOSIS — I10 Essential (primary) hypertension: Secondary | ICD-10-CM | POA: Diagnosis not present

## 2018-06-20 DIAGNOSIS — M5441 Lumbago with sciatica, right side: Secondary | ICD-10-CM | POA: Diagnosis not present

## 2018-06-20 DIAGNOSIS — Z79899 Other long term (current) drug therapy: Secondary | ICD-10-CM | POA: Insufficient documentation

## 2018-06-20 DIAGNOSIS — M545 Low back pain: Secondary | ICD-10-CM | POA: Diagnosis present

## 2018-06-20 MED ORDER — METHOCARBAMOL 500 MG PO TABS: 500.0000 mg | ORAL_TABLET | Freq: Two times a day (BID) | ORAL | 0 refills | Status: DC

## 2018-06-20 MED ORDER — KETOROLAC TROMETHAMINE 30 MG/ML IJ SOLN
30.0000 mg | Freq: Once | INTRAMUSCULAR | Status: AC
  Administered 2018-06-20: 30 mg via INTRAMUSCULAR
  Filled 2018-06-20: qty 1

## 2018-06-20 MED ORDER — NAPROXEN 500 MG PO TABS: 500.0000 mg | ORAL_TABLET | Freq: Two times a day (BID) | ORAL | 0 refills | Status: DC

## 2018-06-20 NOTE — ED Notes (Signed)
ED Provider at bedside. 

## 2018-06-20 NOTE — ED Triage Notes (Signed)
Pt c/o low back pain, right leg and foot pain onset yesterday. Pt reports that she has been doing yard work and cleaning around the house. Pt denies urinary symptoms or incontinent of bowels

## 2018-06-20 NOTE — Discharge Instructions (Addendum)
Medications: Naprosyn, Robaxin  Treatment: Take Naprosyn twice daily.  You can alternate with Tylenol as prescribed over-the-counter.  Take Robaxin twice daily as needed for muscle pain or spasms.  Do not drive or operate machinery while taking this medication.  Alternate ice and heat on your back, alternating 20 minutes on, 20 minutes off.  Use ice on your ankle 3-4 times daily alternating 20 minutes on, 20 minutes off as well.  Attempt the exercises and stretches as tolerated.  Follow-up: Please follow-up with your doctor by the end of the week for recheck and further evaluation.  Please return the emergency department if you develop any new or worsening symptoms.

## 2018-06-20 NOTE — ED Notes (Signed)
PMS intact before and after. Pt tolerated well. All questions answered. 
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
## 2018-06-20 NOTE — ED Notes (Signed)
Pt took last available dose of naproxen at home this morning.

## 2018-10-21 DIAGNOSIS — M722 Plantar fascial fibromatosis: Secondary | ICD-10-CM | POA: Insufficient documentation

## 2018-11-09 DIAGNOSIS — F418 Other specified anxiety disorders: Secondary | ICD-10-CM | POA: Insufficient documentation

## 2019-07-17 ENCOUNTER — Other Ambulatory Visit: Payer: Self-pay

## 2019-07-17 ENCOUNTER — Encounter (HOSPITAL_BASED_OUTPATIENT_CLINIC_OR_DEPARTMENT_OTHER): Payer: Self-pay

## 2019-07-17 ENCOUNTER — Emergency Department (HOSPITAL_BASED_OUTPATIENT_CLINIC_OR_DEPARTMENT_OTHER)
Admission: EM | Admit: 2019-07-17 | Discharge: 2019-07-17 | Disposition: A | Discharge status: Home / Self Care | Payer: BLUE CROSS/BLUE SHIELD | Attending: Emergency Medicine | Admitting: Emergency Medicine

## 2019-07-17 DIAGNOSIS — Z20828 Contact with and (suspected) exposure to other viral communicable diseases: Secondary | ICD-10-CM | POA: Diagnosis not present

## 2019-07-17 DIAGNOSIS — B349 Viral infection, unspecified: Secondary | ICD-10-CM | POA: Insufficient documentation

## 2019-07-17 DIAGNOSIS — Z79899 Other long term (current) drug therapy: Secondary | ICD-10-CM | POA: Insufficient documentation

## 2019-07-17 DIAGNOSIS — Z20822 Contact with and (suspected) exposure to covid-19: Secondary | ICD-10-CM

## 2019-07-17 DIAGNOSIS — I10 Essential (primary) hypertension: Secondary | ICD-10-CM | POA: Diagnosis not present

## 2019-07-17 DIAGNOSIS — Z7982 Long term (current) use of aspirin: Secondary | ICD-10-CM | POA: Diagnosis not present

## 2019-07-17 DIAGNOSIS — R070 Pain in throat: Secondary | ICD-10-CM | POA: Diagnosis present

## 2019-07-17 NOTE — ED Triage Notes (Signed)
Pt reports she has been exposed to a COVID + contact. Pt c/o sore throat, HA, body aches that started on 7/17.

## 2019-07-17 NOTE — ED Provider Notes (Addendum)
[facility] EMERGENCY DEPARTMENT Provider Note   CSN: [id number] Arrival date & time: 07/17/19  1703    History   Chief Complaint Chief Complaint  Patient presents with  . Sore Throat    HPI Kelli Khan is a 53 y.o. female.     Patient is a 53 year old female who presents with body aches and fatigue.  She reports a 3 to 4-day history of feeling fatigued and having myalgias.  She developed a little bit of a sore throat yesterday.  She has a little bit of a dry cough.  No shortness of breath.  Mild rhinorrhea.  No known fevers.  No rashes.  No vomiting or diarrhea.  She has had a known COVID exposure at work but she has not been tested.     Past Medical History:  Diagnosis Date  . Hypertension     Patient Active Problem List   Diagnosis Date Noted  . Episodic lightheadedness 01/02/2016  . Pressure in head 01/02/2016  . Right wrist injury 01/02/2016    Past Surgical History:  Procedure Laterality Date  . MYOMECTOMY       OB History   No obstetric history on file.      Home Medications    Prior to Admission medications   Medication Sig Start Date End Date Taking? Authorizing Provider  aspirin 81 MG tablet Take 81 mg by mouth daily.    [provider]  carvedilol (COREG) 12.5 MG tablet Take 1 tablet by mouth daily. Reported on 01/02/2016 12/04/15   [provider]  cyclobenzaprine (FLEXERIL) 5 MG tablet Take 1 tablet (5 mg total) by mouth at bedtime as needed for muscle spasms. 12/11/17   Caccavale, Sophia, PA-C  losartan (COZAAR) 50 MG tablet Take 50 mg by mouth daily.    [provider]  MELATONIN PO Take by mouth.    [provider]  methocarbamol (ROBAXIN) 500 MG tablet Take 1 tablet (500 mg total) by mouth 2 (two) times daily. 06/20/18   Law, Bea Graff, PA-C  naproxen (NAPROSYN) 500 MG tablet Take 1 tablet (500 mg total) by mouth 2 (two) times daily with a meal. 06/20/18   Law, Bea Graff, PA-C  zolpidem (AMBIEN) 5  MG tablet TK 1 T PO NIGHTLY PRN FOR SLEEP 02/03/16   [provider]    Family History Family History  Problem Relation Age of Onset  . Heart attack Mother        age "75-40's"  . Hypertension Mother   . Heart attack Sister        age "12-50's:  Marland Kitchen Hypertension Father   . Heart failure Father   . Cancer Father        prostate    Social History Social History   Tobacco Use  . Smoking status: Never Smoker  . Smokeless tobacco: Never Used  Substance Use Topics  . Alcohol use: Yes    Alcohol/week: 0.0 standard drinks  . Drug use: No     Allergies   Codeine and Percocet [oxycodone-acetaminophen]   Review of Systems Review of Systems  Constitutional: Positive for fatigue. Negative for chills, diaphoresis and fever.  HENT: Positive for sore throat. Negative for congestion, rhinorrhea and sneezing.   Eyes: Negative.   Respiratory: Positive for cough. Negative for chest tightness and shortness of breath.   Cardiovascular: Negative for chest pain and leg swelling.  Gastrointestinal: Negative for abdominal pain, blood in stool, diarrhea, nausea and vomiting.  Genitourinary: Negative for difficulty  urinating, flank pain, frequency and hematuria.  Musculoskeletal: Positive for myalgias. Negative for arthralgias and back pain.  Skin: Negative for rash.  Neurological: Negative for dizziness, speech difficulty, weakness, numbness and headaches.     Physical Exam Updated Vital Signs BP 124/84 (BP Location: Right Arm)   Pulse 70   Temp 98.5 F (36.9 C) (Oral)   Resp 18   Ht 5' 7.5" (1.715 m)   Wt 90.7 kg   LMP 10/17/2014   SpO2 100%   BMI 30.86 kg/m   Physical Exam Constitutional:      Appearance: She is well-developed.  HENT:     Head: Normocephalic and atraumatic.     Nose: No congestion.     Mouth/Throat:     Pharynx: Uvula midline. No pharyngeal swelling, oropharyngeal exudate, posterior oropharyngeal erythema or uvula swelling.  Eyes:     Pupils:  Pupils are equal, round, and reactive to light.  Neck:     Musculoskeletal: Normal range of motion and neck supple.  Cardiovascular:     Rate and Rhythm: Normal rate and regular rhythm.     Heart sounds: Normal heart sounds.  Pulmonary:     Effort: Pulmonary effort is normal. No respiratory distress.     Breath sounds: Normal breath sounds. No wheezing or rales.  Chest:     Chest wall: No tenderness.  Abdominal:     General: Bowel sounds are normal.     Palpations: Abdomen is soft.     Tenderness: There is no abdominal tenderness. There is no guarding or rebound.  Musculoskeletal: Normal range of motion.  Lymphadenopathy:     Cervical: No cervical adenopathy.  Skin:    General: Skin is warm and dry.     Findings: No rash.  Neurological:     Mental Status: She is alert and oriented to person, place, and time.      ED Treatments / Results  Labs (all labs ordered are listed, but only abnormal results are displayed) Labs Reviewed  NOVEL CORONAVIRUS, NAA (HOSPITAL ORDER, SEND-OUT TO REF LAB)    EKG None  Radiology No results found.  Procedures Procedures (including critical care time)  Medications Ordered in ED Medications - No data to display   Initial Impression / Assessment and Plan / ED Course  I have reviewed the triage vital signs and the nursing notes.  Pertinent labs & imaging results that were available during my care of the patient were reviewed by me and considered in my medical decision making (see chart for details).        Patient presents with viral-like symptoms.  She is well-appearing.  She has no hypoxia.  No shortness of breath.  No vomiting.  Her lungs are clear without suggestions of pneumonia.  She was tested for COVID but these tests are pending.  She was given COVID precautions and other symptomatic care instructions.  Return precautions were given.  I did offer strep test given her sore throat but she declined.  Final Clinical  Impressions(s) / ED Diagnoses   Final diagnoses:  Viral syndrome  Exposure to Covid-19 Virus    ED Discharge Orders    None       Malvin Johns, MD 07/17/19 1754    Malvin Johns, MD 07/17/19 1806

## 2019-07-20 LAB — NOVEL CORONAVIRUS, NAA (HOSP ORDER, SEND-OUT TO REF LAB; TAT 18-24 HRS): SARS-CoV-2, NAA: NOT DETECTED

## 2019-09-22 ENCOUNTER — Other Ambulatory Visit: Payer: Self-pay

## 2019-09-22 ENCOUNTER — Encounter (HOSPITAL_BASED_OUTPATIENT_CLINIC_OR_DEPARTMENT_OTHER): Payer: Self-pay | Admitting: *Deleted

## 2019-09-22 ENCOUNTER — Emergency Department (HOSPITAL_BASED_OUTPATIENT_CLINIC_OR_DEPARTMENT_OTHER)
Admission: EM | Admit: 2019-09-22 | Discharge: 2019-09-22 | Disposition: A | Discharge status: Home / Self Care | Payer: BLUE CROSS/BLUE SHIELD | Attending: Emergency Medicine | Admitting: Emergency Medicine

## 2019-09-22 ENCOUNTER — Emergency Department (HOSPITAL_BASED_OUTPATIENT_CLINIC_OR_DEPARTMENT_OTHER): Payer: BLUE CROSS/BLUE SHIELD

## 2019-09-22 DIAGNOSIS — M5442 Lumbago with sciatica, left side: Secondary | ICD-10-CM | POA: Diagnosis not present

## 2019-09-22 DIAGNOSIS — W19XXXA Unspecified fall, initial encounter: Secondary | ICD-10-CM | POA: Diagnosis not present

## 2019-09-22 DIAGNOSIS — Z79899 Other long term (current) drug therapy: Secondary | ICD-10-CM | POA: Diagnosis not present

## 2019-09-22 DIAGNOSIS — Y999 Unspecified external cause status: Secondary | ICD-10-CM | POA: Insufficient documentation

## 2019-09-22 DIAGNOSIS — Y92007 Garden or yard of unspecified non-institutional (private) residence as the place of occurrence of the external cause: Secondary | ICD-10-CM | POA: Insufficient documentation

## 2019-09-22 DIAGNOSIS — S93492A Sprain of other ligament of left ankle, initial encounter: Secondary | ICD-10-CM | POA: Diagnosis not present

## 2019-09-22 DIAGNOSIS — M25562 Pain in left knee: Secondary | ICD-10-CM | POA: Insufficient documentation

## 2019-09-22 DIAGNOSIS — Z7982 Long term (current) use of aspirin: Secondary | ICD-10-CM | POA: Insufficient documentation

## 2019-09-22 DIAGNOSIS — Y939 Activity, unspecified: Secondary | ICD-10-CM | POA: Diagnosis not present

## 2019-09-22 DIAGNOSIS — I10 Essential (primary) hypertension: Secondary | ICD-10-CM | POA: Diagnosis not present

## 2019-09-22 DIAGNOSIS — S99912A Unspecified injury of left ankle, initial encounter: Secondary | ICD-10-CM | POA: Diagnosis present

## 2019-09-22 MED ORDER — ACETAMINOPHEN 500 MG PO TABS
1000.0000 mg | ORAL_TABLET | Freq: Once | ORAL | Status: AC
  Administered 2019-09-22: 1000 mg via ORAL
  Filled 2019-09-22: qty 2

## 2019-09-22 MED ORDER — KETOROLAC TROMETHAMINE 15 MG/ML IJ SOLN
15.0000 mg | Freq: Once | INTRAMUSCULAR | Status: AC
  Administered 2019-09-22: 15 mg via INTRAMUSCULAR
  Filled 2019-09-22: qty 1

## 2019-09-22 NOTE — Discharge Instructions (Signed)

## 2019-09-22 NOTE — ED Triage Notes (Signed)
Pt co fall with lower back pain x 2 days

## 2019-09-22 NOTE — ED Provider Notes (Signed)
[facility] EMERGENCY DEPARTMENT Provider Note   CSN: [id number] Arrival date & time: 09/22/19  [id number]     History   Chief Complaint Chief Complaint  Patient presents with  . Back Pain    HPI Kelli Khan is a 53 y.o. female.     52 yo F with a chief complaints of low back pain.  Patient states that she was in the yard and she lost her balance but did not fall.  Was complaining of diffuse low back pain and some pain to her left knee and ankle.  Feels that the pain in the left ankle is actually the worst spine.  Pain with bearing weight but able to ambulate.  This started a few days ago.  Symptoms have gotten worse.  She denies loss of bowel or bladder denies loss.  Sensation denies fever denies abdominal pain.  Has had problem with her back before.  The history is provided by the patient.  Back Pain Location:  Lumbar spine Quality:  Aching Radiates to:  L foot Pain severity:  Moderate Pain is:  Same all the time Onset quality:  Gradual Duration:  2 days Timing:  Constant Progression:  Worsening Chronicity:  New Relieved by:  Nothing Worsened by:  Nothing Ineffective treatments:  None tried Associated symptoms: no chest pain, no dysuria, no fever and no headaches     Past Medical History:  Diagnosis Date  . Hypertension     Patient Active Problem List   Diagnosis Date Noted  . Episodic lightheadedness 01/02/2016  . Pressure in head 01/02/2016  . Right wrist injury 01/02/2016    Past Surgical History:  Procedure Laterality Date  . MYOMECTOMY       OB History   No obstetric history on file.      Home Medications    Prior to Admission medications   Medication Sig Start Date End Date Taking? Authorizing Provider  aspirin 81 MG tablet Take 81 mg by mouth daily.    [provider]  carvedilol (COREG) 12.5 MG tablet Take 1 tablet by mouth daily. Reported on 01/02/2016 12/04/15   [provider]  cyclobenzaprine (FLEXERIL) 5 MG tablet  Take 1 tablet (5 mg total) by mouth at bedtime as needed for muscle spasms. 12/11/17   Caccavale, Sophia, PA-C  losartan (COZAAR) 50 MG tablet Take 50 mg by mouth daily.    [provider]  MELATONIN PO Take by mouth.    [provider]  methocarbamol (ROBAXIN) 500 MG tablet Take 1 tablet (500 mg total) by mouth 2 (two) times daily. 06/20/18   Law, Bea Graff, PA-C  naproxen (NAPROSYN) 500 MG tablet Take 1 tablet (500 mg total) by mouth 2 (two) times daily with a meal. 06/20/18   Law, Bea Graff, PA-C  zolpidem (AMBIEN) 5 MG tablet TK 1 T PO NIGHTLY PRN FOR SLEEP 02/03/16   [provider]    Family History Family History  Problem Relation Age of Onset  . Heart attack Mother        age "75-40's"  . Hypertension Mother   . Heart attack Sister        age "74-50's:  Marland Kitchen Hypertension Father   . Heart failure Father   . Cancer Father        prostate    Social History Social History   Tobacco Use  . Smoking status: Never Smoker  . Smokeless tobacco: Never Used  Substance Use Topics  . Alcohol use: Yes  Alcohol/week: 0.0 standard drinks  . Drug use: No     Allergies   Codeine and Percocet [oxycodone-acetaminophen]   Review of Systems Review of Systems  Constitutional: Negative for chills and fever.  HENT: Negative for congestion and rhinorrhea.   Eyes: Negative for redness and visual disturbance.  Respiratory: Negative for shortness of breath and wheezing.   Cardiovascular: Negative for chest pain and palpitations.  Gastrointestinal: Negative for nausea and vomiting.  Genitourinary: Negative for dysuria and urgency.  Musculoskeletal: Positive for back pain. Negative for arthralgias and myalgias.  Skin: Negative for pallor and wound.  Neurological: Negative for dizziness and headaches.     Physical Exam Updated Vital Signs BP (!) 158/88 (BP Location: Right Arm)   Pulse 78   Temp 98.3 F (36.8 C)   Resp 20   Ht 5\' 7"  (1.702 m)   Wt 114 kg    LMP 10/17/2014   SpO2 99%   BMI 39.36 kg/m   Physical Exam Vitals signs and nursing note reviewed.  Constitutional:      General: She is not in acute distress.    Appearance: She is well-developed. She is not diaphoretic.  HENT:     Head: Normocephalic and atraumatic.  Eyes:     Pupils: Pupils are equal, round, and reactive to light.  Neck:     Musculoskeletal: Normal range of motion and neck supple.  Cardiovascular:     Rate and Rhythm: Normal rate and regular rhythm.     Heart sounds: No murmur. No friction rub. No gallop.   Pulmonary:     Effort: Pulmonary effort is normal.     Breath sounds: No wheezing or rales.  Abdominal:     General: There is no distension.     Palpations: Abdomen is soft.     Tenderness: There is no abdominal tenderness.  Musculoskeletal:        General: Tenderness present.     Comments: No appreciable back tenderness.  No midline spinal tenderness.  No pain to the piriformis.  She has some mild tenderness to the lateral aspect of the left knee at the fibular neck.  She has full range of motion without pain.  She has some pain to the medial and lateral aspect of the ankle though not specifically overlying the malleolus.  Pulse motor and sensation are intact to the foot.  Reflexes are 2+ and equal.  Skin:    General: Skin is warm and dry.  Neurological:     Mental Status: She is alert and oriented to person, place, and time.  Psychiatric:        Behavior: Behavior normal.      ED Treatments / Results  Labs (all labs ordered are listed, but only abnormal results are displayed) Labs Reviewed - No data to display  EKG None  Radiology Dg Ankle Complete Left  Result Date: 09/22/2019 CLINICAL DATA:  Left ankle pain. Fall 2 days ago. EXAM: LEFT ANKLE COMPLETE - 3+ VIEW COMPARISON:  Ankle radiograph 01/12/2014 FINDINGS: There is no evidence of fracture, dislocation, or joint effusion. There is no evidence of arthropathy or other focal bone  abnormality. Soft tissue edema versus habitus. IMPRESSION: No fracture or subluxation of the left ankle. Electronically Signed   By: Keith Rake M.D.   On: 09/22/2019 22:53   Dg Knee Complete 4 Views Left  Result Date: 09/22/2019 CLINICAL DATA:  Left knee pain. Fall 2 days ago. EXAM: LEFT KNEE - COMPLETE 4+ VIEW COMPARISON:  Radiograph  01/12/2014 FINDINGS: No acute fracture or dislocation. 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 No significant joint effusion. No focal soft tissue abnormality. IMPRESSION: 1. No acute fracture or dislocation. 2. 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 Electronically Signed   By: Keith Rake M.D.   On: 09/22/2019 22:54    Procedures Procedures (including critical care time)  Medications Ordered in ED Medications  acetaminophen (TYLENOL) tablet 1,000 mg (1,000 mg Oral Given 09/22/19 2259)  ketorolac (TORADOL) 15 MG/ML injection 15 mg (15 mg Intramuscular Given 09/22/19 2259)     Initial Impression / Assessment and Plan / ED Course  I have reviewed the triage vital signs and the nursing notes.  Pertinent labs & imaging results that were available during my care of the patient were reviewed by me and considered in my medical decision making (see chart for details).        53 yo F with a chief complaints of losing her balance and straining her back her ankle and her left knee.  The started a couple days ago.  Difficult to ascertain if she is describing sciatica or more in individual strains.  She does appear to have focal pain when I push on the ankle and the knee.  I got an x-ray of these which is negative is viewed by me.  She has no appreciable back pain.  No red flags.  No trauma.  We will discharge the patient home.  PCP follow-up.  11:01 PM:  I have discussed the diagnosis/risks/treatment options with the patient and believe the pt to be  eligible for discharge home to follow-up with PCP. We also discussed returning to the ED immediately if new or worsening sx occur. We discussed the sx which are most concerning (e.g., sudden worsening pain, fever, inability to tolerate by mouth) that necessitate immediate return. Medications administered to the patient during their visit and any new prescriptions provided to the patient are listed below.  Medications given during this visit Medications  acetaminophen (TYLENOL) tablet 1,000 mg (1,000 mg Oral Given 09/22/19 2259)  ketorolac (TORADOL) 15 MG/ML injection 15 mg (15 mg Intramuscular Given 09/22/19 2259)     The patient appears reasonably screen and/or stabilized for discharge and I doubt any other medical condition or other [facility] requiring further screening, evaluation, or treatment in the ED at this time prior to discharge.    Final Clinical Impressions(s) / ED Diagnoses   Final diagnoses:  Acute left-sided low back pain with left-sided sciatica  Sprain of anterior talofibular ligament of left ankle, initial encounter    ED Discharge Orders    None       Deno Etienne, DO 09/22/19 2301

## 2019-09-22 NOTE — ED Notes (Signed)
Patient transported to X-ray 

## 2020-07-04 DIAGNOSIS — M47817 Spondylosis without myelopathy or radiculopathy, lumbosacral region: Secondary | ICD-10-CM | POA: Insufficient documentation

## 2020-07-17 ENCOUNTER — Other Ambulatory Visit: Payer: Self-pay

## 2020-07-17 ENCOUNTER — Emergency Department
Admission: EM | Admit: 2020-07-17 | Discharge: 2020-07-17 | Disposition: A | Discharge status: Home / Self Care | Payer: BLUE CROSS/BLUE SHIELD | Source: Home / Self Care | Attending: Family Medicine | Admitting: Family Medicine

## 2020-07-17 ENCOUNTER — Emergency Department (INDEPENDENT_AMBULATORY_CARE_PROVIDER_SITE_OTHER): Payer: BLUE CROSS/BLUE SHIELD

## 2020-07-17 ENCOUNTER — Encounter: Payer: Self-pay | Admitting: Emergency Medicine

## 2020-07-17 DIAGNOSIS — S93402A Sprain of unspecified ligament of left ankle, initial encounter: Secondary | ICD-10-CM

## 2020-07-17 DIAGNOSIS — W1842XA Slipping, tripping and stumbling without falling due to stepping into hole or opening, initial encounter: Secondary | ICD-10-CM

## 2020-07-17 DIAGNOSIS — S93602A Unspecified sprain of left foot, initial encounter: Secondary | ICD-10-CM

## 2020-07-17 NOTE — ED Triage Notes (Signed)
Twisted left foot yesterday afternoon- shooting pain after stepping in a hole while using a push mower  Pt elevated left foot - took naprosyn last night and at 1100 this am Pt states increased throbbing when standing- min swelling noted Pt unable to bear weight on left foot No COVID vaccine

## 2020-07-17 NOTE — Discharge Instructions (Addendum)
Apply ice pack for 30 minutes every 1 to 2 hours today and tomorrow.  Elevate.  Use crutches for about a week. Wear Ace wrap until swelling decreases.  Wear brace for about 2 to 3 weeks.  Begin range of motion and stretching exercises in about 5 days as per instruction sheet. May take Aleve, 2 tabs every 12 hours with food.  May also take Tylenol for pain.

## 2020-07-17 NOTE — ED Provider Notes (Signed)
[facility]    CSN: [id number] Arrival date & time: 07/17/20  1608      History   Chief Complaint Chief Complaint  Patient presents with  . Foot Injury    left    HPI Kelli Khan is a 54 y.o. female.   While mowing her lawn yesterday, patient stepped into a hole with her left foot, twisting her foot and ankle.  She has pain when standing and walking.    The history is provided by the patient.  Foot Injury Location:  Ankle and foot Time since incident:  1 day Injury: yes   Mechanism of injury comment:  Inverted ankle Ankle location:  L ankle Foot location:  L foot Pain details:    Quality:  Aching   Radiates to:  Does not radiate   Severity:  Moderate   Onset quality:  Sudden   Duration:  1 day   Timing:  Constant   Progression:  Unchanged Chronicity:  New Prior injury to area:  No Relieved by:  Nothing Worsened by:  Bearing weight and activity Ineffective treatments:  NSAIDs Associated symptoms: back pain, stiffness and swelling   Associated symptoms: no decreased ROM, no muscle weakness, no numbness and no tingling     Past Medical History:  Diagnosis Date  . Anemia 05/14/2018  . Hypertension     Patient Active Problem List   Diagnosis Date Noted  . Lumbosacral spondylosis without myelopathy 07/04/2020  . Situational anxiety 11/09/2018  . Plantar fasciitis, bilateral 10/21/2018  . Anemia 05/14/2018  . Uterine leiomyoma 05/14/2018  . Acute pain of right shoulder 12/21/2017  . Acute right-sided low back pain without sciatica 12/21/2017  . Elevated sed rate 12/21/2017  . Hot flash not due to menopause 12/21/2017  . Chronic constipation 12/15/2017  . Health maintenance examination 09/30/2016  . BMI 35.0-35.9,adult 09/30/2016  . Migraine with aura and without status migrainosus, not intractable 05/28/2016  . Eczema 02/24/2016  . Benign essential HTN 02/24/2016  . Gastroesophageal reflux disease without esophagitis 02/24/2016  .  Hypercholesterolemia 02/24/2016  . Episodic lightheadedness 01/02/2016  . Pressure in head 01/02/2016  . Right wrist injury 01/02/2016    Past Surgical History:  Procedure Laterality Date  . MYOMECTOMY      OB History   No obstetric history on file.      Home Medications    Prior to Admission medications   Medication Sig Start Date End Date Taking? Authorizing Provider  aspirin 81 MG tablet Take 81 mg by mouth daily.   Yes [provider]  carvedilol (COREG) 12.5 MG tablet Take 1 tablet by mouth daily. Reported on 01/02/2016 12/04/15  Yes [provider]  losartan (COZAAR) 50 MG tablet Take 50 mg by mouth daily.   Yes [provider]  MELATONIN PO Take 5 mg by mouth at bedtime as needed.    Yes [provider]  cyclobenzaprine (FLEXERIL) 5 MG tablet Take 1 tablet (5 mg total) by mouth at bedtime as needed for muscle spasms. 12/11/17   Caccavale, Sophia, PA-C  methocarbamol (ROBAXIN) 500 MG tablet Take 1 tablet (500 mg total) by mouth 2 (two) times daily. 06/20/18   Law, Bea Graff, PA-C  naproxen (NAPROSYN) 500 MG tablet Take 1 tablet (500 mg total) by mouth 2 (two) times daily with a meal. 06/20/18   Law, Bea Graff, PA-C  zolpidem (AMBIEN) 5 MG tablet TK 1 T PO NIGHTLY PRN FOR SLEEP 02/03/16   [provider]  Family History Family History  Problem Relation Age of Onset  . Heart attack Mother        age "78-40's"  . Hypertension Mother   . Heart attack Sister        age "68-50's:  Marland Kitchen Hypertension Father   . Heart failure Father   . Cancer Father        prostate    Social History Social History   Tobacco Use  . Smoking status: Never Smoker  . Smokeless tobacco: Never Used  Vaping Use  . Vaping Use: Never used  Substance Use Topics  . Alcohol use: Not Currently  . Drug use: No     Allergies   Codeine, Percocet [oxycodone-acetaminophen], Sumatriptan, and Tramadol   Review of Systems Review of Systems    Musculoskeletal: Positive for back pain and stiffness.  All other systems reviewed and are negative.    Physical Exam Triage Vital Signs ED Triage Vitals  Enc Vitals Group     BP 07/17/20 1625 134/83     Pulse Rate 07/17/20 1625 71     Resp 07/17/20 1625 17     Temp 07/17/20 1625 99.3 F (37.4 C)     Temp Source 07/17/20 1625 Oral     SpO2 07/17/20 1625 100 %     Weight --      Height --      Head Circumference --      Peak Flow --      Pain Score 07/17/20 1630 4     Pain Loc --      Pain Edu? --      Excl. in [place]? --    No data found.  Updated Vital Signs BP 134/83 (BP Location: Right Arm)   Pulse 71   Temp 99.3 F (37.4 C) (Oral)   Resp 17   LMP 10/17/2014   SpO2 100%   Visual Acuity Right Eye Distance:   Left Eye Distance:   Bilateral Distance:    Right Eye Near:   Left Eye Near:    Bilateral Near:     Physical Exam Vitals and nursing note reviewed.  Constitutional:      General: She is not in acute distress. HENT:     Head: Atraumatic.  Eyes:     Pupils: Pupils are equal, round, and reactive to light.  Cardiovascular:     Rate and Rhythm: Normal rate.  Pulmonary:     Effort: Pulmonary effort is normal.  Musculoskeletal:     Left ankle: Swelling present. No deformity or lacerations. Tenderness present over the medial malleolus. No base of 5th metatarsal or proximal fibula tenderness. Decreased range of motion. Anterior drawer test negative. Normal pulse.       Feet:     Comments: Left ankle:  Decreased range of motion.  Tenderness and mild swelling over the medial malleolus.  Joint stable.  No tenderness over the base of the fifth metatarsal.  Distal neurovascular function is intact.  There is also tenderness to palpation over the medial dorsal left foot  as noted on diagram.    Skin:    General: Skin is warm and dry.  Neurological:     Mental Status: She is alert and oriented to person, place, and time.      UC Treatments / Results   Labs (all labs ordered are listed, but only abnormal results are displayed) Labs Reviewed - No data to display  EKG   Radiology DG Ankle Complete Left  Result Date: 07/17/2020 CLINICAL DATA:  Stepped in a hole, twisting injury EXAM: LEFT ANKLE COMPLETE - 3+ VIEW COMPARISON:  07/17/2020, 09/22/2019 FINDINGS: No fracture or malalignment. Ankle mortise is symmetric. Small plantar calcaneal spur. There is soft tissue swelling. IMPRESSION: No acute osseous abnormality. Electronically Signed   By: Donavan Foil M.D.   On: 07/17/2020 17:47   DG Foot Complete Left  Result Date: 07/17/2020 CLINICAL DATA:  55 year old female with trauma to the left foot. EXAM: LEFT FOOT - COMPLETE 3+ VIEW COMPARISON:  Left ankle radiograph dated 09/22/2019. FINDINGS: There is no evidence of fracture or dislocation. There is no evidence of arthropathy or other focal bone abnormality. Soft tissues are unremarkable. IMPRESSION: Negative. Electronically Signed   By: Anner Crete M.D.   On: 07/17/2020 17:23    Procedures Procedures (including critical care time)  Medications Ordered in UC Medications - No data to display  Initial Impression / Assessment and Plan / UC Course  I have reviewed the triage vital signs and the nursing notes.  Pertinent labs & imaging results that were available during my care of the patient were reviewed by me and considered in my medical decision making (see chart for details).    No evidence fracture.  Applied ace wrap.  Dispensed AirCast stirrup splint. Rx given for crutches.  Given sprain treatment instructions with range of motion and stretching exercises.  Followup with Dr. Aundria Mems ([facility]) if not improving about three weeks.    Final Clinical Impressions(s) / UC Diagnoses   Final diagnoses:  Moderate left ankle sprain, initial encounter  Foot sprain, left, initial encounter     Discharge Instructions     Apply ice pack for 30 minutes  every 1 to 2 hours today and tomorrow.  Elevate.  Use crutches for about a week. Wear Ace wrap until swelling decreases.  Wear brace for about 2 to 3 weeks.  Begin range of motion and stretching exercises in about 5 days as per instruction sheet. May take Aleve, 2 tabs every 12 hours with food.  May also take Tylenol for pain.     ED Prescriptions    None        Kandra Nicolas, MD 07/17/20 [phone]

## 2024-02-19 ENCOUNTER — Encounter: Payer: Self-pay | Admitting: Emergency Medicine

## 2024-02-19 ENCOUNTER — Ambulatory Visit
Admission: EM | Admit: 2024-02-19 | Discharge: 2024-02-19 | Disposition: A | Discharge status: Home / Self Care | Payer: 59 | Attending: Family Medicine | Admitting: Family Medicine

## 2024-02-19 ENCOUNTER — Telehealth: Payer: Self-pay

## 2024-02-19 ENCOUNTER — Ambulatory Visit: Payer: 59

## 2024-02-19 DIAGNOSIS — M654 Radial styloid tenosynovitis [de Quervain]: Secondary | ICD-10-CM | POA: Diagnosis not present

## 2024-02-19 DIAGNOSIS — M1812 Unilateral primary osteoarthritis of first carpometacarpal joint, left hand: Secondary | ICD-10-CM | POA: Diagnosis not present

## 2024-02-19 DIAGNOSIS — M25532 Pain in left wrist: Secondary | ICD-10-CM

## 2024-02-19 MED ORDER — CELECOXIB 200 MG PO CAPS: 200.0000 mg | ORAL_CAPSULE | Freq: Every day | ORAL | 0 refills | Status: AC

## 2024-02-19 MED ORDER — PREDNISONE 10 MG (21) PO TBPK: ORAL_TABLET | Freq: Every day | ORAL | 0 refills | Status: AC

## 2024-02-19 NOTE — Discharge Instructions (Addendum)
 Advised patient to take medications as directed with food to completion.  Advised patient to wear left wrist brace 24/7 (except when bathing) for the next 2 weeks.  Encouraged to increase daily water intake to 64 ounces per day while taking these medications.  Advised patient if symptoms worsen and/or unresolved please follow-up with PCP, [facility], or here for further evaluation.

## 2024-02-19 NOTE — ED Triage Notes (Signed)
 Patient c/o left wrist pain that radiates up to her shoulder x 1 week.  No apparent injury.  Patient has been applying an OTC cream.  Also c/o upper abdominal pain x 4 days, worse in the mornings than subsides as the day goes on.  History of acid reflux several years ago.

## 2024-02-19 NOTE — ED Provider Notes (Signed)
 [facility]    CSN: [id number] Arrival date & time: 02/19/24  1246      History   Chief Complaint Chief Complaint  Patient presents with   Wrist Pain    HPI Kelli Khan is a 58 y.o. female.   HPI 58 year old female presents with left wrist pain that radiated to her shoulder for 1 week.  PMH significant for obesity, anemia, and HTN.  Past Medical History:  Diagnosis Date   Anemia 05/14/2018   Hypertension     Patient Active Problem List   Diagnosis Date Noted   Lumbosacral spondylosis without myelopathy 07/04/2020   Situational anxiety 11/09/2018   Plantar fasciitis, bilateral 10/21/2018   Anemia 05/14/2018   Uterine leiomyoma 05/14/2018   Acute pain of right shoulder 12/21/2017   Acute right-sided low back pain without sciatica 12/21/2017   Elevated sed rate 12/21/2017   Hot flash not due to menopause 12/21/2017   Chronic constipation 12/15/2017   Health maintenance examination 09/30/2016   BMI 35.0-35.9,adult 09/30/2016   Migraine with aura and without status migrainosus, not intractable 05/28/2016   Eczema 02/24/2016   Benign essential HTN 02/24/2016   Gastroesophageal reflux disease without esophagitis 02/24/2016   Hypercholesterolemia 02/24/2016   Episodic lightheadedness 01/02/2016   Pressure in head 01/02/2016   Right wrist injury 01/02/2016    Past Surgical History:  Procedure Laterality Date   MYOMECTOMY      OB History   No obstetric history on file.      Home Medications    Prior to Admission medications   Medication Sig Start Date End Date Taking? Authorizing Provider  aspirin 81 MG tablet Take 81 mg by mouth daily.   Yes [provider]  celecoxib (CELEBREX) 200 MG capsule Take 1 capsule (200 mg total) by mouth daily for 15 days. 02/19/24 03/05/24 Yes Trevor Iha, FNP  losartan (COZAAR) 50 MG tablet Take 50 mg by mouth daily.   Yes [provider]  MELATONIN PO Take 5 mg by mouth at bedtime as needed.    Yes  [provider]  predniSONE (STERAPRED UNI-PAK 21 TAB) 10 MG (21) TBPK tablet Take by mouth daily. Take 6 tabs by mouth daily  for 2 days, then 5 tabs for 2 days, then 4 tabs for 2 days, then 3 tabs for 2 days, 2 tabs for 2 days, then 1 tab by mouth daily for 2 days 02/19/24  Yes Trevor Iha, FNP  carvedilol (COREG) 12.5 MG tablet Take 1 tablet by mouth daily. Reported on 01/02/2016 12/04/15   [provider]  zolpidem (AMBIEN) 5 MG tablet TK 1 T PO NIGHTLY PRN FOR SLEEP 02/03/16   [provider]    Family History Family History  Problem Relation Age of Onset   Heart attack Mother        age "59-40's"   Hypertension Mother    Heart attack Sister        age "6-50's:   Hypertension Father    Heart failure Father    Cancer Father        prostate    Social History Social History   Tobacco Use   Smoking status: Never   Smokeless tobacco: Never  Vaping Use   Vaping status: Never Used  Substance Use Topics   Alcohol use: Not Currently   Drug use: No     Allergies   Codeine, Percocet [oxycodone-acetaminophen], Sumatriptan, and Tramadol   Review of Systems Review of Systems   Physical Exam  Triage Vital Signs ED Triage Vitals  Encounter Vitals Group     BP 02/19/24 1422 136/89     Systolic BP Percentile --      Diastolic BP Percentile --      Pulse Rate 02/19/24 1422 67     Resp 02/19/24 1422 18     Temp 02/19/24 1422 98.2 F (36.8 C)     Temp Source 02/19/24 1422 Oral     SpO2 02/19/24 1422 98 %     Weight 02/19/24 1425 220 lb (99.8 kg)     Height 02/19/24 1425 5' 7.5" (1.715 m)     Head Circumference --      Peak Flow --      Pain Score 02/19/24 1425 10     Pain Loc --      Pain Education --      Exclude from Growth Chart --    No data found.  Updated Vital Signs BP 136/89 (BP Location: Right Arm)   Pulse 67   Temp 98.2 F (36.8 C) (Oral)   Resp 18   Ht 5' 7.5" (1.715 m)   Wt 220 lb (99.8 kg)   LMP 10/17/2014   SpO2 98%    BMI 33.95 kg/m    Physical Exam Vitals and nursing note reviewed.  Constitutional:      Appearance: Normal appearance. She is normal weight.  HENT:     Head: Normocephalic and atraumatic.     Mouth/Throat:     Mouth: Mucous membranes are moist.     Pharynx: Oropharynx is clear.  Eyes:     Extraocular Movements: Extraocular movements intact.     Conjunctiva/sclera: Conjunctivae normal.     Pupils: Pupils are equal, round, and reactive to light.  Cardiovascular:     Rate and Rhythm: Normal rate and regular rhythm.     Pulses: Normal pulses.     Heart sounds: Normal heart sounds.  Pulmonary:     Effort: Pulmonary effort is normal.     Breath sounds: Normal breath sounds. No wheezing, rhonchi or rales.  Musculoskeletal:        General: Normal range of motion.     Cervical back: Normal range of motion and neck supple.     Comments: Left wrist (dorsum): TTP with moderate soft tissue swelling noted limited range of motion with flexion extension due to pain; positive Finkelstein, positive ulnar deviation, positive Tinel's sign; grip is 2/5 today, neurovascular/neurosensory intact, brisk cap refill  Skin:    General: Skin is warm and dry.  Neurological:     General: No focal deficit present.     Mental Status: She is alert and oriented to person, place, and time.  Psychiatric:        Mood and Affect: Mood normal.        Behavior: Behavior normal.      UC Treatments / Results  Labs (all labs ordered are listed, but only abnormal results are displayed) Labs Reviewed - No data to display  EKG   Radiology DG Wrist Complete Left Result Date: 02/19/2024 CLINICAL DATA:  Left wrist pain for 2 weeks. EXAM: LEFT WRIST - COMPLETE 3+ VIEW COMPARISON:  Left wrist radiographs 08/23/2011 FINDINGS: Mild degenerative spurring at the distal lateral scaphoid and adjacent proximal volar lateral trapezium. Moderate thumb carpometacarpal joint space narrowing and subchondral sclerosis with mild  peripheral osteophytosis. No acute fracture or dislocation. IMPRESSION: 1. Moderate thumb carpometacarpal osteoarthritis. 2. Mild degenerative spurring at the distal lateral scaphoid and  adjacent trapezium. Electronically Signed   By: Neita Garnet M.D.   On: 02/19/2024 15:56    Procedures Procedures (including critical care time)  Medications Ordered in UC Medications - No data to display  Initial Impression / Assessment and Plan / UC Course  I have reviewed the triage vital signs and the nursing notes.  Pertinent labs & imaging results that were available during my care of the patient were reviewed by me and considered in my medical decision making (see chart for details).     MDM: 1.  Left wrist pain-left wrist x-ray results revealed above, Rx'd Celebrex 200 mg capsule: Take 1 capsule daily x 15 days; 2.  De Quervain's tenosynovitis, left-left wrist brace with thumb spica placed on patient prior to discharge with specific instructions, Rx'd Sterapred Unipak (tapering from 60 mg to 10 mg over 10 days). Advised patient to take medications as directed with food to completion.  Advised patient to wear left wrist brace 24/7 (except when bathing) for the next 2 weeks.  Encouraged to increase daily water intake to 64 ounces per day while taking these medications.  Advised patient if symptoms worsen and/or unresolved please follow-up with PCP, [facility], or here for further evaluation.  Patient discharged home, hemodynamically stable. Final Clinical Impressions(s) / UC Diagnoses   Final diagnoses:  Left wrist pain  De Quervain's tenosynovitis, left     Discharge Instructions      Advised patient to take medications as directed with food to completion.  Advised patient to wear left wrist brace 24/7 (except when bathing) for the next 2 weeks.  Encouraged to increase daily water intake to 64 ounces per day while taking these medications.  Advised patient if symptoms worsen and/or  unresolved please follow-up with PCP, [facility], or here for further evaluation.     ED Prescriptions     Medication Sig Dispense Auth. Provider   celecoxib (CELEBREX) 200 MG capsule Take 1 capsule (200 mg total) by mouth daily for 15 days. 15 capsule Trevor Iha, FNP   predniSONE (STERAPRED UNI-PAK 21 TAB) 10 MG (21) TBPK tablet Take by mouth daily. Take 6 tabs by mouth daily  for 2 days, then 5 tabs for 2 days, then 4 tabs for 2 days, then 3 tabs for 2 days, 2 tabs for 2 days, then 1 tab by mouth daily for 2 days 42 tablet Trevor Iha, FNP      PDMP not reviewed this encounter.   Trevor Iha, FNP 02/19/24 1606
